# Patient Record
Sex: Male | Born: 1965
Health system: Southern US, Community
[De-identification: ages and names within clinical notes are randomized; demographics above are authoritative.]

## PROBLEM LIST (undated history)

## (undated) DIAGNOSIS — E785 Hyperlipidemia, unspecified: Secondary | ICD-10-CM

## (undated) DIAGNOSIS — S069X9A Unspecified intracranial injury with loss of consciousness of unspecified duration, initial encounter: Secondary | ICD-10-CM

## (undated) DIAGNOSIS — Z5900 Homelessness unspecified: Secondary | ICD-10-CM

## (undated) DIAGNOSIS — S134XXA Sprain of ligaments of cervical spine, initial encounter: Secondary | ICD-10-CM

## (undated) DIAGNOSIS — I10 Essential (primary) hypertension: Secondary | ICD-10-CM

## (undated) DIAGNOSIS — S069XAA Unspecified intracranial injury with loss of consciousness status unknown, initial encounter: Secondary | ICD-10-CM

## (undated) HISTORY — DX: Hyperlipidemia, unspecified: E78.5

## (undated) HISTORY — PX: APPENDECTOMY: SHX54

---

## 2011-07-06 ENCOUNTER — Encounter: Payer: Self-pay | Admitting: Emergency Medicine

## 2011-07-06 ENCOUNTER — Emergency Department (HOSPITAL_COMMUNITY)
Admission: EM | Admit: 2011-07-06 | Discharge: 2011-07-07 | Discharge status: Against Medical Advice | Payer: Self-pay | Attending: Emergency Medicine | Admitting: Emergency Medicine

## 2011-07-06 DIAGNOSIS — R42 Dizziness and giddiness: Secondary | ICD-10-CM | POA: Insufficient documentation

## 2011-07-06 HISTORY — DX: Essential (primary) hypertension: I10
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
## 2011-10-26 ENCOUNTER — Emergency Department (HOSPITAL_COMMUNITY)
Admission: EM | Admit: 2011-10-26 | Discharge: 2011-10-26 | Disposition: A | Discharge status: Home Health | Payer: Self-pay | Attending: Emergency Medicine | Admitting: Emergency Medicine

## 2011-10-26 ENCOUNTER — Encounter (HOSPITAL_COMMUNITY): Payer: Self-pay | Admitting: Family Medicine

## 2011-10-26 DIAGNOSIS — R1084 Generalized abdominal pain: Secondary | ICD-10-CM | POA: Insufficient documentation

## 2011-10-26 DIAGNOSIS — R112 Nausea with vomiting, unspecified: Secondary | ICD-10-CM | POA: Insufficient documentation

## 2011-10-26 DIAGNOSIS — I10 Essential (primary) hypertension: Secondary | ICD-10-CM | POA: Insufficient documentation

## 2011-10-26 DIAGNOSIS — E119 Type 2 diabetes mellitus without complications: Secondary | ICD-10-CM | POA: Insufficient documentation

## 2011-10-26 DIAGNOSIS — R197 Diarrhea, unspecified: Secondary | ICD-10-CM | POA: Insufficient documentation

## 2011-10-26 LAB — URINALYSIS, ROUTINE W REFLEX MICROSCOPIC
Glucose, UA: >1000 mg/dL — AB
Leukocytes, UA: NEGATIVE
Nitrite: NEGATIVE
Protein, ur: 30 mg/dL — AB
pH: 6 (ref 5.0–8.0)

## 2011-10-26 LAB — POCT I-STAT, CHEM 8
Calcium, Ion: 1.14 mmol/L (ref 1.12–1.32)
Creatinine, Ser: 1.2 mg/dL (ref 0.50–1.35)
Glucose, Bld: 333 mg/dL — ABNORMAL HIGH (ref 70–99)
Hemoglobin: 20.4 g/dL — ABNORMAL HIGH (ref 13.0–17.0)
Sodium: 137 mEq/L (ref 135–145)
TCO2: 27 mmol/L (ref 0–100)

## 2011-10-26 LAB — URINE MICROSCOPIC-ADD ON

## 2011-10-26 LAB — GLUCOSE, CAPILLARY: Glucose-Capillary: 300 mg/dL — ABNORMAL HIGH (ref 70–99)

## 2011-10-26 MED ORDER — ONDANSETRON HCL 4 MG/2ML IJ SOLN
4.0000 mg | Freq: Once | INTRAMUSCULAR | Status: AC
  Administered 2011-10-26: 4 mg via INTRAVENOUS
  Filled 2011-10-26: qty 2

## 2011-10-26 MED ORDER — METFORMIN HCL 500 MG PO TABS: 500.0000 mg | ORAL_TABLET | Freq: Two times a day (BID) | ORAL | Status: DC

## 2011-10-26 MED ORDER — PROMETHAZINE HCL 25 MG PO TABS: 25.0000 mg | ORAL_TABLET | Freq: Four times a day (QID) | ORAL | Status: DC | PRN

## 2011-10-26 MED ORDER — SODIUM CHLORIDE 0.9 % IV BOLUS (SEPSIS)
1000.0000 mL | Freq: Once | INTRAVENOUS | Status: AC
  Administered 2011-10-26: 1000 mL via INTRAVENOUS

## 2011-10-26 NOTE — Discharge Instructions (Signed)
Follow up with your primary care doctor about your hospital visit. Continue to hydrate orally.Take all medications as prescribed & use Phenergan as directed for nausea & vomiting.  Read the instructions below for reasons to return to the ER.  ° °The 'BRAT' diet is suggested, then progress to diet as tolerated as symptoms abate. Call if bloody stools, persistent diarrhea, vomiting, fever or abdominal pain. °Bananas.  °Rice.  °Applesauce.  °Toast (and other simple starches such as crackers, potatoes, noodles).  ° °SEEK IMMEDIATE MEDICAL ATTENTION IF: ° °You begin having localized abdominal pain that does not go away or becomes severe (The right side could  possibly be appendicitis. In an adult, the left lower portion of the abdomen could be colitis or diverticulitis) °  °A temperature above 101 develops ° °Repeated vomiting occurs (multiple uncontrollable episodes) or you are unable to keep fluids down ° °Blood is being passed in stools or vomit (bright red or black tarry stools).  ° °Return also if you develop chest pain, difficulty breathing, dizziness or fainting, or become confused, poorly responsive, or inconsolable (young children). ° ° °RESOURCE GUIDE ° °Dental Problems ° °Patients with Medicaid: °[facility]                     [facility] °[address]                                           [address] °Phone:  [phone]                                                  Phone:  [phone] ° °If unable to pay or uninsured, contact:  [facility] or [facility] to become qualified for the adult dental clinic. ° °Chronic Pain Problems °Contact [facility]  [phone] °Patients need to be referred by their primary care doctor. ° °Insufficient Money for Medicine °Contact [employer]:  call "211" or [facility] 271-5999. ° °No Primary Care Doctor °Call [facility]  832-8000 °Other agencies that provide inexpensive medical care ° [facility]  832-8035 °   [facility]  832-7272 °   [facility]  [phone] °   [facility]  832-4777 °   [facility]  [phone] °   [facility]  [phone] ° °Psychological Services ° Health  [phone] °[facility]  [phone] °[facility]   [phone] (emergency services 641-4993) ° °Substance Abuse Resources °[facility]  336-882-2125 °[facility] 336-784-9470 °[facility] 336-285-9073 °[facility] 336-845-3988 °Residential & Outpatient Substance Abuse Program  800-659-3381 ° °Abuse/Neglect °[place] [facility] (336) 641-3795 °[place] [facility] 800-378-5315 (After Hours) ° °Emergency Shelter °[facility] (336) 271-5985 ° °Maternity Homes °[facility] (336) 275-9566 °[facility] (704) 372-4663 ° °MRSA Hotline #:   832-7006 ° ° ° °[place] [facility] ° °[facility]     [employer]                          [facility] °[address] [place]                       [address]      [address]  °[place]                                                [place]                            [place] °Phone:  349-3220                                   Phone:  [phone]                 Phone:  342-8140 ° °[facility] °Phone:  342-8316 ° °[place] [facility] °(336) 342-1394 °(336) 342-3537 (After Hours) ° ° ° ° °

## 2011-10-26 NOTE — ED Notes (Signed)
Pt reports having N/V/D starting yesterday morning. Reports feeling weak and having abdominal pain.

## 2011-10-26 NOTE — ED Notes (Signed)
Pt given urinal and told of need for urine specimen. NAD noted at this time.

## 2011-10-26 NOTE — ED Provider Notes (Signed)
History     CSN: [id number]  Arrival date & time 10/26/11  1515   First MD Initiated Contact with Patient 10/26/11 1622      Chief Complaint  Patient presents with  . Abdominal Pain  . Emesis  . Diarrhea    (Consider location/radiation/quality/duration/timing/severity/associated sxs/prior treatment) HPI Comments: Patient comes in today with a chief complaint of nausea, vomiting, and diarrhea that began yesterday.  He reports three episodes of vomiting and approximately 10 episodes of diarrhea.  No blood in his emesis or stool.  He also is complaining of some generalized abdominal pain.  He describes the pain as a "hunger pain."  He denies any fever or chills.  He has not taken anything for his symptoms.  No sick contacts  Patient is a 46 y.o. male presenting with vomiting. The history is provided by the patient.  Emesis  The emesis has an appearance of stomach contents. There has been no fever. Associated symptoms include abdominal pain and diarrhea. Pertinent negatives include no chills and no fever.    Past Medical History  Diagnosis Date  . Diabetes mellitus   . Hypertension     Past Surgical History  Procedure Date  . Appendectomy     History reviewed. No pertinent family history.  History  Substance Use Topics  . Smoking status: Never Smoker   . Smokeless tobacco: Not on file  . Alcohol Use: No      Review of Systems  Constitutional: Negative for fever and chills.  Respiratory: Negative for shortness of breath.   Cardiovascular: Negative for chest pain.  Gastrointestinal: Positive for nausea, vomiting, abdominal pain and diarrhea. Negative for blood in stool and anal bleeding.  Genitourinary: Negative for dysuria, hematuria, decreased urine volume and difficulty urinating.  Neurological: Negative for dizziness, syncope and light-headedness.    Allergies  Review of patient's allergies indicates no known allergies.  Home Medications   Current Outpatient  Rx  Name Route Sig Dispense Refill  . ADULT MULTIVITAMIN W/MINERALS CH Oral Take 1 tablet by mouth daily.      BP 165/108  Pulse 82  Temp(Src) 98.3 F (36.8 C) (Oral)  Resp 16  SpO2 98%  Physical Exam  Nursing note and vitals reviewed. Constitutional: He appears well-developed and well-nourished. No distress.  HENT:  Head: Normocephalic and atraumatic.  Mouth/Throat: Oropharynx is clear and moist.  Neck: Normal range of motion. Neck supple.  Cardiovascular: Normal rate, regular rhythm and normal heart sounds.   Pulmonary/Chest: Effort normal and breath sounds normal.  Abdominal: Soft. Bowel sounds are normal. He exhibits no distension and no mass. There is no tenderness. There is no rebound and no guarding.  Neurological: He is alert.  Skin: Skin is warm and dry. He is not diaphoretic.  Psychiatric: He has a normal mood and affect.    ED Course  Procedures (including critical care time)   Labs Reviewed  URINALYSIS, ROUTINE W REFLEX MICROSCOPIC   No results found.   No diagnosis found.  7:45 PM Reassessed patient.  Patient able to tolerate po liquids.  Patient asking for something to eat.  MDM  Patient comes in today with a chief complaint of N/V/D and mild generalized abdominal pain.  Pt afebrile.  No abdominal tenderness to palpation on exam.  Patient given IVF and antiemetics.  Patient able to tolerate po liquids.  Patient discharged home with Rx for antiemetics.  Return precautions discussed.  Patient verbalizes understanding.        Pascal Lux  Maralyn Sago, PA-C 10/28/11 0107

## 2011-11-08 NOTE — ED Provider Notes (Signed)
Medical screening examination/treatment/procedure(s) were performed by non-physician practitioner and as supervising physician I was immediately available for consultation/collaboration.  Loren Racer, MD 11/08/11 [phone]

## 2012-04-29 ENCOUNTER — Emergency Department (HOSPITAL_COMMUNITY)
Admission: EM | Admit: 2012-04-29 | Discharge: 2012-04-29 | Disposition: A | Discharge status: Home / Self Care | Payer: No Typology Code available for payment source | Attending: Emergency Medicine | Admitting: Emergency Medicine

## 2012-04-29 ENCOUNTER — Encounter (HOSPITAL_COMMUNITY): Payer: Self-pay | Admitting: Emergency Medicine

## 2012-04-29 DIAGNOSIS — I10 Essential (primary) hypertension: Secondary | ICD-10-CM | POA: Insufficient documentation

## 2012-04-29 DIAGNOSIS — E119 Type 2 diabetes mellitus without complications: Secondary | ICD-10-CM | POA: Insufficient documentation

## 2012-04-29 DIAGNOSIS — Y92410 Unspecified street and highway as the place of occurrence of the external cause: Secondary | ICD-10-CM | POA: Insufficient documentation

## 2012-04-29 DIAGNOSIS — T07XXXA Unspecified multiple injuries, initial encounter: Secondary | ICD-10-CM | POA: Insufficient documentation

## 2012-04-29 DIAGNOSIS — Y9389 Activity, other specified: Secondary | ICD-10-CM | POA: Insufficient documentation

## 2012-04-29 MED ORDER — CYCLOBENZAPRINE HCL 10 MG PO TABS: 10.0000 mg | ORAL_TABLET | Freq: Two times a day (BID) | ORAL | Status: DC | PRN

## 2012-04-29 MED ORDER — IBUPROFEN 600 MG PO TABS: 600.0000 mg | ORAL_TABLET | Freq: Four times a day (QID) | ORAL | Status: DC | PRN
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
## 2012-04-29 NOTE — ED Provider Notes (Signed)
Medical screening examination/treatment/procedure(s) were performed by non-physician practitioner and as supervising physician I was immediately available for consultation/collaboration.  Raeford Razor, MD 04/29/12 [phone]
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
## 2012-05-29 ENCOUNTER — Ambulatory Visit: Payer: Self-pay | Attending: Orthopaedic Surgery | Admitting: Rehabilitative and Restorative Service Providers"

## 2012-05-29 DIAGNOSIS — R293 Abnormal posture: Secondary | ICD-10-CM | POA: Insufficient documentation

## 2012-05-29 DIAGNOSIS — M25519 Pain in unspecified shoulder: Secondary | ICD-10-CM | POA: Insufficient documentation

## 2012-05-29 DIAGNOSIS — M542 Cervicalgia: Secondary | ICD-10-CM | POA: Insufficient documentation

## 2012-05-29 DIAGNOSIS — IMO0001 Reserved for inherently not codable concepts without codable children: Secondary | ICD-10-CM | POA: Insufficient documentation

## 2012-05-31 ENCOUNTER — Ambulatory Visit: Payer: Self-pay | Admitting: Rehabilitative and Restorative Service Providers"

## 2012-06-01 ENCOUNTER — Encounter: Payer: Self-pay | Admitting: Physical Therapy

## 2012-06-02 ENCOUNTER — Encounter: Payer: Self-pay | Admitting: Physical Therapy

## 2012-06-06 ENCOUNTER — Ambulatory Visit: Payer: Self-pay | Admitting: Rehabilitative and Restorative Service Providers"

## 2012-06-08 ENCOUNTER — Ambulatory Visit: Payer: Self-pay | Admitting: Rehabilitative and Restorative Service Providers"

## 2012-06-09 ENCOUNTER — Ambulatory Visit: Payer: Self-pay | Admitting: Physical Therapy

## 2012-06-12 ENCOUNTER — Ambulatory Visit: Payer: Self-pay | Admitting: Rehabilitative and Restorative Service Providers"

## 2012-06-14 ENCOUNTER — Ambulatory Visit: Payer: Self-pay | Admitting: Physical Therapy

## 2012-06-15 ENCOUNTER — Ambulatory Visit: Payer: Self-pay | Admitting: Physical Therapy

## 2012-06-16 ENCOUNTER — Encounter: Payer: Self-pay | Admitting: Physical Therapy

## 2012-06-26 ENCOUNTER — Ambulatory Visit: Payer: Self-pay | Admitting: Rehabilitative and Restorative Service Providers"

## 2012-06-29 ENCOUNTER — Encounter: Payer: Self-pay | Admitting: Rehabilitative and Restorative Service Providers"

## 2012-07-03 ENCOUNTER — Encounter: Payer: Self-pay | Admitting: Physical Therapy

## 2012-07-05 ENCOUNTER — Encounter: Payer: Self-pay | Admitting: Physical Therapy

## 2012-07-30 ENCOUNTER — Emergency Department (HOSPITAL_COMMUNITY)
Admission: EM | Admit: 2012-07-30 | Discharge: 2012-07-31 | Disposition: A | Discharge status: Home / Self Care | Payer: Self-pay | Attending: Emergency Medicine | Admitting: Emergency Medicine

## 2012-07-30 ENCOUNTER — Emergency Department (HOSPITAL_COMMUNITY): Payer: Self-pay

## 2012-07-30 ENCOUNTER — Encounter (HOSPITAL_COMMUNITY): Payer: Self-pay | Admitting: Emergency Medicine

## 2012-07-30 DIAGNOSIS — E119 Type 2 diabetes mellitus without complications: Secondary | ICD-10-CM | POA: Insufficient documentation

## 2012-07-30 DIAGNOSIS — Y929 Unspecified place or not applicable: Secondary | ICD-10-CM | POA: Insufficient documentation

## 2012-07-30 DIAGNOSIS — Y9367 Activity, basketball: Secondary | ICD-10-CM | POA: Insufficient documentation

## 2012-07-30 DIAGNOSIS — S8990XA Unspecified injury of unspecified lower leg, initial encounter: Secondary | ICD-10-CM | POA: Insufficient documentation

## 2012-07-30 DIAGNOSIS — I10 Essential (primary) hypertension: Secondary | ICD-10-CM | POA: Insufficient documentation

## 2012-07-30 DIAGNOSIS — X58XXXA Exposure to other specified factors, initial encounter: Secondary | ICD-10-CM | POA: Insufficient documentation

## 2012-07-30 HISTORY — DX: Sprain of ligaments of cervical spine, initial encounter: S13.4XXA

## 2012-07-30 MED ORDER — IBUPROFEN 800 MG PO TABS
800.0000 mg | ORAL_TABLET | Freq: Once | ORAL | Status: AC
  Administered 2012-07-30: 800 mg via ORAL
  Filled 2012-07-30: qty 1

## 2012-07-30 NOTE — ED Provider Notes (Signed)
History   This chart was scribed for non-physician practitioner working with [name], DO by [name], ED Scribe. This patient was seen in room WTR6/WTR6 and the patient's care was started at 2306.   CSN: [id number]  Arrival date & time 07/30/12  2306   First MD Initiated Contact with Patient 07/30/12 2309      No chief complaint on file.    The history is provided by the patient. No language interpreter was used.    Vincent Osborne is a 47 y.o. male who presents to the Emergency Department complaining of chronic, constant, mild left knee pain for a while but worsened today after playing basketball. Pt reports jumping when his pain began but denies his knee popping or locking up. Pt rates his pain as 10/10. He has not tried any alleviating factors. Pt denies numbness, tingling or back pain.   Pt has h/o DM, HTN, appendectomy.  Pt denies smoking and alcohol use. Past Medical History  Diagnosis Date  . Diabetes mellitus   . Hypertension     Past Surgical History  Procedure Date  . Appendectomy     No family history on file.  History  Substance Use Topics  . Smoking status: Never Smoker   . Smokeless tobacco: Not on file  . Alcohol Use: No      Review of Systems  A complete 10 system review of systems was obtained and all systems are negative except as noted in the HPI and PMH.    Allergies  Review of patient's allergies indicates no known allergies.  Home Medications   Current Outpatient Rx  Name  Route  Sig  Dispense  Refill  . CYCLOBENZAPRINE HCL 10 MG PO TABS   Oral   Take 1 tablet (10 mg total) by mouth 2 (two) times daily as needed for muscle spasms.   20 tablet   0   . IBUPROFEN 600 MG PO TABS   Oral   Take 1 tablet (600 mg total) by mouth every 6 (six) hours as needed for pain.   30 tablet   0   . ADULT MULTIVITAMIN W/MINERALS CH   Oral   Take 1 tablet by mouth daily.           There were no vitals taken for this  visit.  Physical Exam  Nursing note and vitals reviewed. Constitutional: He is oriented to person, place, and time. He appears well-developed and well-nourished. No distress.  HENT:  Head: Normocephalic and atraumatic.  Eyes: Conjunctivae normal and EOM are normal.  Neck: Neck supple. No tracheal deviation present.  Cardiovascular: Normal rate, regular rhythm, normal heart sounds and intact distal pulses.   Pulses:      Dorsalis pedis pulses are 2+ on the left side.       Posterior tibial pulses are 2+ on the left side.  Pulmonary/Chest: Effort normal and breath sounds normal. No respiratory distress. He has no wheezes. He has no rales. He exhibits no tenderness.  Musculoskeletal: Normal range of motion.       L Knee- No swelling, erythema or bruising. Tenderness at the proximal aspect of the patellar tendon. Pain with ROM but does have full ROM.   Neurological: He is alert and oriented to person, place, and time. He has normal strength. No sensory deficit.  Skin: Skin is warm and dry.  Psychiatric: He has a normal mood and affect. His behavior is normal.    ED Course  Procedures (including critical  care time)  DIAGNOSTIC STUDIES: None performed.   COORDINATION OF CARE:  11:32 PM Discussed treatment plan which includes imaging of left knee and motrin with pt at bedside and pt agreed to plan.     Labs Reviewed - No data to display Dg Knee Complete 4 Views Left  07/31/2012  *RADIOLOGY REPORT*  Clinical Data: Left knee pain after basketball injury.  LEFT KNEE - COMPLETE 4+ VIEW  Comparison: None.  Findings: There is a small circumscribed lesion in the posterior femoral metaphyseal cortex with well-defined sclerotic margins. The appearance is most consistent with an old fibrous cortical defect.  No evidence of acute fracture or subluxation.  No expansile or destructive bone lesions.  Bone cortex and trabecular architecture appear intact.   No significant effusion.  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  No acute bony abnormalities.   Original Report Authenticated By: Burman Nieves, M.D.      1. Knee injury       MDM  47 y/o male with L knee pain s/p injury. Xray without any acute abnormality, however probably fibrous cortical defect in posterior distal femoral metaphysis present. Knee immobilizer and crutches given. Advised rest, ice and elevation. He will f/u with [place] [facility] who he has seen in the past. Patient states his understanding of plan and is agreeable.      I personally performed the services described in this documentation, which was scribed in my presence. The recorded information has been reviewed and is accurate.    [name], PA-C 07/31/12 [phone]

## 2012-07-30 NOTE — ED Notes (Signed)
Pt states he has had left knee pain for quite awhile without too much discomfort. Today he was playing basketball and jumped. Now his knee is hurting significantly more.

## 2012-07-31 NOTE — ED Provider Notes (Signed)
Medical screening examination/treatment/procedure(s) were performed by non-physician practitioner and as supervising physician I was immediately available for consultation/collaboration.   Menaal Russum M Kaizley Aja, DO 07/31/12 1807 

## 2012-09-01 ENCOUNTER — Encounter (HOSPITAL_COMMUNITY): Payer: Self-pay | Admitting: Emergency Medicine

## 2012-09-01 ENCOUNTER — Emergency Department (HOSPITAL_COMMUNITY)
Admission: EM | Admit: 2012-09-01 | Discharge: 2012-09-01 | Disposition: A | Discharge status: Home / Self Care | Payer: No Typology Code available for payment source | Attending: Emergency Medicine | Admitting: Emergency Medicine

## 2012-09-01 DIAGNOSIS — I10 Essential (primary) hypertension: Secondary | ICD-10-CM | POA: Insufficient documentation

## 2012-09-01 DIAGNOSIS — S0993XA Unspecified injury of face, initial encounter: Secondary | ICD-10-CM | POA: Insufficient documentation

## 2012-09-01 DIAGNOSIS — S8990XA Unspecified injury of unspecified lower leg, initial encounter: Secondary | ICD-10-CM | POA: Insufficient documentation

## 2012-09-01 DIAGNOSIS — Y9389 Activity, other specified: Secondary | ICD-10-CM | POA: Insufficient documentation

## 2012-09-01 DIAGNOSIS — S46909A Unspecified injury of unspecified muscle, fascia and tendon at shoulder and upper arm level, unspecified arm, initial encounter: Secondary | ICD-10-CM | POA: Insufficient documentation

## 2012-09-01 DIAGNOSIS — Y92410 Unspecified street and highway as the place of occurrence of the external cause: Secondary | ICD-10-CM | POA: Insufficient documentation

## 2012-09-01 DIAGNOSIS — M7918 Myalgia, other site: Secondary | ICD-10-CM

## 2012-09-01 DIAGNOSIS — IMO0002 Reserved for concepts with insufficient information to code with codable children: Secondary | ICD-10-CM | POA: Insufficient documentation

## 2012-09-01 DIAGNOSIS — S4980XA Other specified injuries of shoulder and upper arm, unspecified arm, initial encounter: Secondary | ICD-10-CM | POA: Insufficient documentation

## 2012-09-01 DIAGNOSIS — Z87828 Personal history of other (healed) physical injury and trauma: Secondary | ICD-10-CM | POA: Insufficient documentation

## 2012-09-01 DIAGNOSIS — S199XXA Unspecified injury of neck, initial encounter: Secondary | ICD-10-CM | POA: Insufficient documentation

## 2012-09-01 DIAGNOSIS — E119 Type 2 diabetes mellitus without complications: Secondary | ICD-10-CM | POA: Insufficient documentation

## 2012-09-01 MED ORDER — IBUPROFEN 400 MG PO TABS
400.0000 mg | ORAL_TABLET | Freq: Once | ORAL | Status: AC
  Administered 2012-09-01: 400 mg via ORAL
  Filled 2012-09-01: qty 1

## 2012-09-01 MED ORDER — TRAMADOL HCL 50 MG PO TABS: 50.0000 mg | ORAL_TABLET | Freq: Four times a day (QID) | ORAL | Status: DC | PRN
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
## 2012-09-01 NOTE — ED Provider Notes (Signed)
Medical screening examination/treatment/procedure(s) were performed by non-physician practitioner and as supervising physician I was immediately available for consultation/collaboration.  Nathan R. Pickering, MD 09/01/12 1519 
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
## 2012-09-22 ENCOUNTER — Emergency Department (HOSPITAL_COMMUNITY): Payer: No Typology Code available for payment source

## 2012-09-22 ENCOUNTER — Encounter (HOSPITAL_COMMUNITY): Payer: Self-pay | Admitting: Emergency Medicine

## 2012-09-22 ENCOUNTER — Emergency Department (HOSPITAL_COMMUNITY)
Admission: EM | Admit: 2012-09-22 | Discharge: 2012-09-22 | Disposition: A | Discharge status: Home / Self Care | Payer: No Typology Code available for payment source | Attending: Emergency Medicine | Admitting: Emergency Medicine

## 2012-09-22 DIAGNOSIS — S0003XA Contusion of scalp, initial encounter: Secondary | ICD-10-CM | POA: Insufficient documentation

## 2012-09-22 DIAGNOSIS — IMO0002 Reserved for concepts with insufficient information to code with codable children: Secondary | ICD-10-CM | POA: Insufficient documentation

## 2012-09-22 DIAGNOSIS — S161XXA Strain of muscle, fascia and tendon at neck level, initial encounter: Secondary | ICD-10-CM

## 2012-09-22 DIAGNOSIS — S0083XA Contusion of other part of head, initial encounter: Secondary | ICD-10-CM | POA: Insufficient documentation

## 2012-09-22 DIAGNOSIS — Y92410 Unspecified street and highway as the place of occurrence of the external cause: Secondary | ICD-10-CM | POA: Insufficient documentation

## 2012-09-22 DIAGNOSIS — Y9389 Activity, other specified: Secondary | ICD-10-CM | POA: Insufficient documentation

## 2012-09-22 DIAGNOSIS — I10 Essential (primary) hypertension: Secondary | ICD-10-CM | POA: Insufficient documentation

## 2012-09-22 DIAGNOSIS — E119 Type 2 diabetes mellitus without complications: Secondary | ICD-10-CM | POA: Insufficient documentation

## 2012-09-22 DIAGNOSIS — S139XXA Sprain of joints and ligaments of unspecified parts of neck, initial encounter: Secondary | ICD-10-CM | POA: Insufficient documentation

## 2012-09-22 DIAGNOSIS — T148XXA Other injury of unspecified body region, initial encounter: Secondary | ICD-10-CM

## 2012-09-22 MED ORDER — HYDROCODONE-ACETAMINOPHEN 5-325 MG PO TABS: 1.0000 | ORAL_TABLET | Freq: Four times a day (QID) | ORAL | Status: DC | PRN

## 2012-09-22 MED ORDER — PROMETHAZINE HCL 25 MG PO TABS: 25.0000 mg | ORAL_TABLET | Freq: Four times a day (QID) | ORAL | Status: DC | PRN

## 2012-09-22 MED ORDER — ACETAMINOPHEN 325 MG PO TABS
650.0000 mg | ORAL_TABLET | Freq: Once | ORAL | Status: AC
  Administered 2012-09-22: 650 mg via ORAL
  Filled 2012-09-22: qty 2

## 2012-09-22 MED ORDER — IBUPROFEN 600 MG PO TABS: 600.0000 mg | ORAL_TABLET | Freq: Four times a day (QID) | ORAL | Status: DC | PRN
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
## 2012-09-22 NOTE — ED Notes (Signed)
Pt given discharge paperwork.  Pt verbalized understanding of d/c and f/u.  No additional questions by pt regarding d/c.  VSS. E-signature obtained.
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
## 2012-09-22 NOTE — Progress Notes (Signed)
Orthopedic Tech Progress Note Patient Details:  Vincent Osborne 1965-12-26 [id number]  Ortho Devices Type of Ortho Device: Knee Immobilizer Ortho Device/Splint Location: (L) LE Ortho Device/Splint Interventions: Ordered;Application   Jennye Moccasin 09/22/2012, 6:06 PM

## 2012-11-07 ENCOUNTER — Ambulatory Visit: Payer: No Typology Code available for payment source

## 2012-11-07 ENCOUNTER — Ambulatory Visit: Payer: No Typology Code available for payment source | Attending: Orthopaedic Surgery | Admitting: Physical Therapy

## 2012-11-07 DIAGNOSIS — M25569 Pain in unspecified knee: Secondary | ICD-10-CM | POA: Insufficient documentation

## 2012-11-07 DIAGNOSIS — M25669 Stiffness of unspecified knee, not elsewhere classified: Secondary | ICD-10-CM | POA: Insufficient documentation

## 2012-11-07 DIAGNOSIS — IMO0001 Reserved for inherently not codable concepts without codable children: Secondary | ICD-10-CM | POA: Insufficient documentation

## 2012-11-14 ENCOUNTER — Ambulatory Visit: Payer: No Typology Code available for payment source | Admitting: Rehabilitation

## 2012-11-16 ENCOUNTER — Ambulatory Visit: Payer: No Typology Code available for payment source | Admitting: Rehabilitation

## 2012-11-21 ENCOUNTER — Ambulatory Visit: Payer: No Typology Code available for payment source | Admitting: Physical Therapy

## 2012-11-23 ENCOUNTER — Ambulatory Visit: Payer: No Typology Code available for payment source | Admitting: Rehabilitation

## 2012-11-28 ENCOUNTER — Ambulatory Visit: Payer: No Typology Code available for payment source | Attending: Orthopaedic Surgery | Admitting: Physical Therapy

## 2012-11-28 DIAGNOSIS — M25569 Pain in unspecified knee: Secondary | ICD-10-CM | POA: Insufficient documentation

## 2012-11-28 DIAGNOSIS — M25669 Stiffness of unspecified knee, not elsewhere classified: Secondary | ICD-10-CM | POA: Insufficient documentation

## 2012-11-28 DIAGNOSIS — IMO0001 Reserved for inherently not codable concepts without codable children: Secondary | ICD-10-CM | POA: Insufficient documentation

## 2012-11-30 ENCOUNTER — Ambulatory Visit: Payer: No Typology Code available for payment source | Admitting: Physical Therapy

## 2013-02-27 ENCOUNTER — Emergency Department (HOSPITAL_COMMUNITY): Payer: Self-pay

## 2013-02-27 ENCOUNTER — Inpatient Hospital Stay (HOSPITAL_COMMUNITY)
Admission: EM | Admit: 2013-02-27 | Discharge: 2013-03-01 | DRG: 065 | Disposition: A | Discharge status: Home / Self Care | Payer: Self-pay | Attending: Family Medicine | Admitting: Family Medicine

## 2013-02-27 DIAGNOSIS — E119 Type 2 diabetes mellitus without complications: Secondary | ICD-10-CM | POA: Diagnosis present

## 2013-02-27 DIAGNOSIS — G8190 Hemiplegia, unspecified affecting unspecified side: Secondary | ICD-10-CM | POA: Diagnosis present

## 2013-02-27 DIAGNOSIS — Z7982 Long term (current) use of aspirin: Secondary | ICD-10-CM

## 2013-02-27 DIAGNOSIS — Z79899 Other long term (current) drug therapy: Secondary | ICD-10-CM

## 2013-02-27 DIAGNOSIS — R279 Unspecified lack of coordination: Secondary | ICD-10-CM | POA: Diagnosis present

## 2013-02-27 DIAGNOSIS — E785 Hyperlipidemia, unspecified: Secondary | ICD-10-CM | POA: Diagnosis present

## 2013-02-27 DIAGNOSIS — R471 Dysarthria and anarthria: Secondary | ICD-10-CM | POA: Diagnosis present

## 2013-02-27 DIAGNOSIS — Z91199 Patient's noncompliance with other medical treatment and regimen due to unspecified reason: Secondary | ICD-10-CM

## 2013-02-27 DIAGNOSIS — Z9119 Patient's noncompliance with other medical treatment and regimen: Secondary | ICD-10-CM

## 2013-02-27 DIAGNOSIS — I6350 Cerebral infarction due to unspecified occlusion or stenosis of unspecified cerebral artery: Principal | ICD-10-CM | POA: Diagnosis present

## 2013-02-27 DIAGNOSIS — R29810 Facial weakness: Secondary | ICD-10-CM | POA: Diagnosis present

## 2013-02-27 DIAGNOSIS — I639 Cerebral infarction, unspecified: Secondary | ICD-10-CM | POA: Diagnosis present

## 2013-02-27 DIAGNOSIS — M3390 Dermatopolymyositis, unspecified, organ involvement unspecified: Secondary | ICD-10-CM

## 2013-02-27 DIAGNOSIS — I10 Essential (primary) hypertension: Secondary | ICD-10-CM | POA: Diagnosis present

## 2013-02-27 LAB — DIFFERENTIAL
Basophils Relative: 0 % (ref 0–1)
Lymphs Abs: 3.5 10*3/uL (ref 0.7–4.0)
Monocytes Relative: 7 % (ref 3–12)
Neutro Abs: 3.4 10*3/uL (ref 1.7–7.7)
Neutrophils Relative %: 44 % (ref 43–77)

## 2013-02-27 LAB — TROPONIN I: Troponin I: <0.3 ng/mL (ref <0.30)

## 2013-02-27 LAB — COMPREHENSIVE METABOLIC PANEL
ALT: 16 U/L (ref 0–53)
Albumin: 3.5 g/dL (ref 3.5–5.2)
Alkaline Phosphatase: 58 U/L (ref 39–117)
BUN: 13 mg/dL (ref 6–23)
Chloride: 101 mEq/L (ref 96–112)
Potassium: 3.3 mEq/L — ABNORMAL LOW (ref 3.5–5.1)
Sodium: 134 mEq/L — ABNORMAL LOW (ref 135–145)
Total Bilirubin: 0.7 mg/dL (ref 0.3–1.2)

## 2013-02-27 LAB — GLUCOSE, CAPILLARY: Glucose-Capillary: 225 mg/dL — ABNORMAL HIGH (ref 70–99)

## 2013-02-27 LAB — CBC
Hemoglobin: 15.5 g/dL (ref 13.0–17.0)
Platelets: 179 10*3/uL (ref 150–400)
RBC: 5.56 MIL/uL (ref 4.22–5.81)
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
## 2013-02-27 NOTE — ED Notes (Signed)
Pt states that last night at 9pm he started being off balance. Also began noticing that his speech was slurred. L side of face is somewhat drawn. Saliva pools on that side when speaking. L grip weaker. Has been dropping things. Also noticed his BS was high yesterday, 400s. Only takes metformin.

## 2013-02-28 ENCOUNTER — Inpatient Hospital Stay (HOSPITAL_COMMUNITY): Payer: Self-pay

## 2013-02-28 ENCOUNTER — Encounter (HOSPITAL_COMMUNITY): Payer: Self-pay | Admitting: Internal Medicine

## 2013-02-28 DIAGNOSIS — I639 Cerebral infarction, unspecified: Secondary | ICD-10-CM | POA: Diagnosis present

## 2013-02-28 DIAGNOSIS — E119 Type 2 diabetes mellitus without complications: Secondary | ICD-10-CM | POA: Diagnosis present

## 2013-02-28 DIAGNOSIS — I6789 Other cerebrovascular disease: Secondary | ICD-10-CM

## 2013-02-28 DIAGNOSIS — I10 Essential (primary) hypertension: Secondary | ICD-10-CM | POA: Diagnosis present

## 2013-02-28 LAB — LIPID PANEL
Cholesterol: 191 mg/dL (ref 0–200)
HDL: 35 mg/dL — ABNORMAL LOW (ref >39)
LDL Cholesterol: 119 mg/dL — ABNORMAL HIGH (ref 0–99)
Total CHOL/HDL Ratio: 5.5 RATIO
Triglycerides: 183 mg/dL — ABNORMAL HIGH (ref <150)
VLDL: 37 mg/dL (ref 0–40)

## 2013-02-28 LAB — GLUCOSE, CAPILLARY
Glucose-Capillary: 218 mg/dL — ABNORMAL HIGH (ref 70–99)
Glucose-Capillary: 212 mg/dL — ABNORMAL HIGH (ref 70–99)
Glucose-Capillary: 178 mg/dL — ABNORMAL HIGH (ref 70–99)

## 2013-02-28 LAB — CBC
HCT: 41.4 % (ref 39.0–52.0)
Hemoglobin: 14.5 g/dL (ref 13.0–17.0)
MCH: 28.2 pg (ref 26.0–34.0)
MCHC: 35 g/dL (ref 30.0–36.0)
MCV: 80.5 fL (ref 78.0–100.0)
RBC: 5.14 MIL/uL (ref 4.22–5.81)

## 2013-02-28 LAB — COMPREHENSIVE METABOLIC PANEL
Albumin: 3.2 g/dL — ABNORMAL LOW (ref 3.5–5.2)
Alkaline Phosphatase: 50 U/L (ref 39–117)
BUN: 12 mg/dL (ref 6–23)
CO2: 23 mEq/L (ref 19–32)
Chloride: 102 mEq/L (ref 96–112)
Creatinine, Ser: 1.05 mg/dL (ref 0.50–1.35)
GFR calc non Af Amer: 82 mL/min — ABNORMAL LOW (ref >90)
Potassium: 3.7 mEq/L (ref 3.5–5.1)
Total Bilirubin: 0.4 mg/dL (ref 0.3–1.2)

## 2013-02-28 LAB — RAPID URINE DRUG SCREEN, HOSP PERFORMED
Cocaine: NOT DETECTED
Opiates: NOT DETECTED
Tetrahydrocannabinol: NOT DETECTED

## 2013-02-28 LAB — HEMOGLOBIN A1C
Hgb A1c MFr Bld: 14 % — ABNORMAL HIGH (ref <5.7)
Mean Plasma Glucose: 355 mg/dL — ABNORMAL HIGH (ref <117)

## 2013-02-28 LAB — CREATININE, SERUM: Creatinine, Ser: 0.97 mg/dL (ref 0.50–1.35)

## 2013-02-28 MED ORDER — ATORVASTATIN CALCIUM 20 MG PO TABS
20.0000 mg | ORAL_TABLET | Freq: Every day | ORAL | Status: DC
  Administered 2013-02-28: 20 mg via ORAL
  Filled 2013-02-28 (×2): qty 1

## 2013-02-28 MED ORDER — ENOXAPARIN SODIUM 40 MG/0.4ML ~~LOC~~ SOLN
40.0000 mg | SUBCUTANEOUS | Status: DC
  Administered 2013-02-28 – 2013-03-01 (×2): 40 mg via SUBCUTANEOUS
  Filled 2013-02-28 (×3): qty 0.4

## 2013-02-28 MED ORDER — INSULIN ASPART 100 UNIT/ML ~~LOC~~ SOLN
0.0000 [IU] | Freq: Three times a day (TID) | SUBCUTANEOUS | Status: DC
  Administered 2013-02-28 (×2): 3 [IU] via SUBCUTANEOUS
  Administered 2013-03-01: 2 [IU] via SUBCUTANEOUS
  Administered 2013-03-01: 3 [IU] via SUBCUTANEOUS

## 2013-02-28 MED ORDER — LABETALOL HCL 100 MG PO TABS
100.0000 mg | ORAL_TABLET | Freq: Two times a day (BID) | ORAL | Status: DC
  Administered 2013-02-28: 100 mg via ORAL
  Filled 2013-02-28 (×2): qty 1

## 2013-02-28 MED ORDER — METFORMIN HCL 500 MG PO TABS
1000.0000 mg | ORAL_TABLET | Freq: Two times a day (BID) | ORAL | Status: DC
  Administered 2013-03-01: 1000 mg via ORAL
  Filled 2013-02-28 (×3): qty 2

## 2013-02-28 MED ORDER — ASPIRIN 325 MG PO TABS
325.0000 mg | ORAL_TABLET | Freq: Every day | ORAL | Status: DC
  Administered 2013-02-28 – 2013-03-01 (×2): 325 mg via ORAL
  Filled 2013-02-28 (×2): qty 1

## 2013-02-28 MED ORDER — INSULIN ASPART 100 UNIT/ML ~~LOC~~ SOLN: 0.0000 [IU] | SUBCUTANEOUS | Status: DC

## 2013-02-28 MED ORDER — INSULIN ASPART 100 UNIT/ML ~~LOC~~ SOLN
0.0000 [IU] | SUBCUTANEOUS | Status: DC
  Administered 2013-02-28 (×2): 3 [IU] via SUBCUTANEOUS
  Administered 2013-02-28: 2 [IU] via SUBCUTANEOUS
  Filled 2013-02-28: qty 1

## 2013-02-28 MED ORDER — METOPROLOL TARTRATE 50 MG PO TABS
50.0000 mg | ORAL_TABLET | Freq: Two times a day (BID) | ORAL | Status: DC
  Administered 2013-02-28 – 2013-03-01 (×2): 50 mg via ORAL
  Filled 2013-02-28 (×3): qty 1

## 2013-02-28 NOTE — Progress Notes (Signed)
  Echocardiogram 2D Echocardiogram has been performed.  Georgian Co 02/28/2013, 2:28 PM

## 2013-02-28 NOTE — Progress Notes (Signed)
PT Discharge Note  Patient Details Name: Vincent Osborne MRN: [id number] DOB: [birth year]   Cancelled Treatment:    Reason Eval/Treat Not Completed: PT screened, no needs identified, will sign off.  Pt up independently in room and states his only deficits are in L hand and speech.  Spoke with RN requesting OT and SLP evals.     Sunny Schlein, [place] [phone] 02/28/2013, 1:55 PM

## 2013-02-28 NOTE — Discharge Summary (Signed)
[facility] History and Physical  Vincent Osborne VHQ:[id number] DOB: 1966/05/21    PCP:   None.  Chief Complaint:  Dysarthria and left sided hemiparesis.  HPI: Vincent Osborne is an 47 y.o. male with hx of DM on metformin, HTN on no meds, and hx of severe medical noncompliance, presents to the ER with ictus of acute dysarthria, left facial droop, and left sided weakness and paresthesia over 24 hours prior to arrival.  He denied HA, nausea, vomiting, neck pain, fever or chills.  He was dropping objects from his left hand.  Reason for not seeking help?  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  His labs showed BS of 200's and normal Cr.  His EKG showed sinus rhythm.  He did have some improvement with his speech and left sided strength when I saw him.  Neurology was consulted and hospitalist was asked to admit him for completing his CVA work up.  He has not been on ASA prior to admission.    Procedure MRI/MRA 02/28/2013 IMPRESSION:  9 mm acute infarction affecting the right cerebral  peduncle/posterior limb internal capsule. No hemorrhage or  significant swelling.   Findings: Both internal carotid arteries are widely patent into the  brain. The anterior and middle cerebral vessels are normal without  proximal stenosis, aneurysm or vascular malformation.   CT head without contrast 02/27/2013 IMPRESSION:  Subtle age indeterminate hypodensity involving the posterior limb  of the right internal capsule. Finding may represent an acute  ischemic infarct. No acute intracranial hemorrhage identified.  Correlation with MRI could be performed as clinically indicated.  Echocardiogram 02/28/2013 Left ventricle: The cavity size was normal. Wall thickness was normal.  --LVEF: 45% to 50%. Wall motion was normal; there were no  regional wall motion abnormalities. Transthoracic echocardiography.   Carotid Doppler 02/28/2013 Final result called in as being within normal limits    Past Medical History  Diagnosis Date  . Diabetes mellitus   . Hypertension   . Whiplash     04/2012    Past Surgical History  Procedure Laterality Date  . Appendectomy      Medications:  HOME MEDS: Prior to Admission medications   Medication Sig Start Date End Date Taking? Authorizing Provider  metFORMIN (GLUCOPHAGE) 500 MG tablet Take 500 mg by mouth 2 (two) times daily with a meal.   Yes Historical Provider, MD  NAPROXEN PO Take 2 tablets by mouth daily as needed (pain).   Yes Historical Provider, MD     Allergies:  No Known Allergies  Social History:   reports that he has never smoked. He has never used smokeless tobacco. He reports that he does not drink alcohol or use illicit drugs.  Family History: History reviewed. No pertinent family history.   Physical Exam: Filed Vitals:   02/28/13 0220 02/28/13 0536 02/28/13 1012 02/28/13 1750  BP: 179/104 152/85 176/115 157/100  Pulse: 72 88 84 89  Temp: 98.1 F (36.7 C) 98.1 F (36.7 C) 98.2 F (36.8 C) 98.3 F (36.8 C)  TempSrc: Oral Oral Oral Oral  Resp: 18 18 18 20   Height:  6\' 2"  (1.88 m)    Weight:  95.845 kg (211 lb 4.8 oz)    SpO2: 99% 100% 100% 99%   Blood pressure 157/100, pulse 89, temperature 98.3 F (36.8 C), temperature source  Oral, resp. rate 20, height 6\' 2"  (1.88 m), weight 95.845 kg (211 lb 4.8 oz), SpO2 99.00%.  GEN: A/O x 4, NAD. PSYCH:  Poor understanding of disease process . HEART: Regular rate and rhythm.  There are no murmur, rub, or gallops.  DP/PT pulse 2+ bilateral ABDOMEN: soft and non-tender; no masses, no organomegaly, normal abdominal bowel sounds; no pannus; no intertriginous candida. There is no rebound and no distention. CNS: Cranial nerves 2-12 grossly intact, patient has mild facial drooping on the left, pupils equal round  reactive to light and accommodation, tongue and uvula midline, positive left-sided pronator drift, negative Romberg, negative aphasia, DTR are normal bilaterally, Babinski is negative and left strength is slightly weaker than right.  No sensory loss.   Labs on Admission:  Basic Metabolic Panel:  Recent Labs Lab 02/27/13 2225 02/28/13 0430  NA 134*  --   K 3.3*  --   CL 101  --   CO2 25  --   GLUCOSE 230*  --   BUN 13  --   CREATININE 1.05 0.97  CALCIUM 9.1  --    Liver Function Tests:  Recent Labs Lab 02/27/13 2225  AST 19  ALT 16  ALKPHOS 58  BILITOT 0.7  PROT 6.5  ALBUMIN 3.5   No results found for this basename: LIPASE, AMYLASE,  in the last 168 hours No results found for this basename: AMMONIA,  in the last 168 hours CBC:  Recent Labs Lab 02/27/13 2225 02/28/13 0430  WBC 7.7 7.7  NEUTROABS 3.4  --   HGB 15.5 14.5  HCT 45.1 41.4  MCV 81.1 80.5  PLT 179 186   Cardiac Enzymes:  Recent Labs Lab 02/27/13 2225  TROPONINI <0.30    CBG:  Recent Labs Lab 02/28/13 0107 02/28/13 0432 02/28/13 0750 02/28/13 1144 02/28/13 1644  GLUCAP 218* 184* 187* 212* 168*     Radiological Exams on Admission: Ct Head Wo Contrast  02/27/2013   *RADIOLOGY REPORT*  Clinical Data: Left-sided weakness and facial droop  CT HEAD WITHOUT CONTRAST  Technique:  Contiguous axial images were obtained from the base of the skull through the vertex without contrast.  Comparison: Prior study from 09/22/2012  Findings: There is no acute intracranial hemorrhage.  There is a subtle hypodensity involving the posterior limb of the right internal capsule (series 2, image 17)., not definitely seen on prior examination.  Finding may represent an ischemic infarct. Otherwise, gray-white matter differentiation is preserved.  No mass lesion or midline shift.  No extra-axial fluid collection.  IMPRESSION:  Subtle age indeterminate hypodensity involving the posterior limb of the right internal capsule.   Finding may represent an acute ischemic infarct.  No acute intracranial hemorrhage identified. Correlation with MRI could be performed as clinically indicated.   Original Report Authenticated By: Rise Mu, M.D.   Mri Brain Without Contrast  02/28/2013   *RADIOLOGY REPORT*  Clinical Data:  Acute stroke.  Left facial weakness and facial droop.  MRI HEAD WITHOUT CONTRAST MRA HEAD WITHOUT CONTRAST  Technique:  Multiplanar, multiecho pulse sequences of the brain and surrounding structures were obtained without intravenous contrast. Angiographic images of the head were obtained using MRA technique without contrast.  Comparison:  Head CT 02/27/2013  MRI HEAD  Findings:  Diffusion imaging shows an 9 mm acute infarction affecting the posterior limb internal capsule/cerebral peduncle on the right.  No hemorrhage.  Elsewhere, the brain has a normal appearance without other old infarction.  No mass lesion, hemorrhage, hydrocephalus  or extra-axial collection.  No pituitary mass.  No inflammatory sinus disease.  IMPRESSION: 9 mm acute infarction affecting the right cerebral peduncle/posterior limb internal capsule.  No hemorrhage or significant swelling.  MRA HEAD  Findings: Both internal carotid arteries are widely patent into the brain.  The anterior and middle cerebral vessels are normal without proximal stenosis, aneurysm or vascular malformation.  Both vertebral arteries are patent with the right being dominant. The basilar artery is smooth and of normal caliber.  Posterior circulation branch vessels are patent.  IMPRESSION: Normal intracranial MR angiography of the large and medium-sized vessels.   Original Report Authenticated By: Paulina Fusi, M.D.   Mr Mra Head/brain Wo Cm  02/28/2013   *RADIOLOGY REPORT*  Clinical Data:  Acute stroke.  Left facial weakness and facial droop.  MRI HEAD WITHOUT CONTRAST MRA HEAD WITHOUT CONTRAST  Technique:  Multiplanar, multiecho pulse sequences of the brain and surrounding  structures were obtained without intravenous contrast. Angiographic images of the head were obtained using MRA technique without contrast.  Comparison:  Head CT 02/27/2013  MRI HEAD  Findings:  Diffusion imaging shows an 9 mm acute infarction affecting the posterior limb internal capsule/cerebral peduncle on the right.  No hemorrhage.  Elsewhere, the brain has a normal appearance without other old infarction.  No mass lesion, hemorrhage, hydrocephalus or extra-axial collection.  No pituitary mass.  No inflammatory sinus disease.  IMPRESSION: 9 mm acute infarction affecting the right cerebral peduncle/posterior limb internal capsule.  No hemorrhage or significant swelling.  MRA HEAD  Findings: Both internal carotid arteries are widely patent into the brain.  The anterior and middle cerebral vessels are normal without proximal stenosis, aneurysm or vascular malformation.  Both vertebral arteries are patent with the right being dominant. The basilar artery is smooth and of normal caliber.  Posterior circulation branch vessels are patent.  IMPRESSION: Normal intracranial MR angiography of the large and medium-sized vessels.   Original Report Authenticated By: Paulina Fusi, M.D.   Assessment/Plan Present on Admission:  . Acute CVA (cerebrovascular accident) . HTN (hypertension) with goal to be determined . Diabetes   1. CVA; patient has small acute 9 mm acute infarction affecting the right cerebral      peduncle/posterior limb internal capsule  2. HTN; patient without insurance therefore we'll start him on metoprolol 50 mg    twice a day. Would also place patient on lisinopril prior to discharge. Would discharge patient until    better control of HTN  3 DM; patient without insurance; 02/28/2013 hemoglobin A1c= 14 restart   metformin 1000 mg twice a day. Considering patient's A1c really needs to be started on insulin which is not covered under the match program, therefore a second agent will need to be added  in the a.m. prior to discharge recommend glyburide 5 mg   Other plans as per orders.  Code Status: FULL CODE.   Drema Dallas, MD. [facility] Pager [phone] 7pm to 7am.  02/28/2013, 6:06 PM

## 2013-02-28 NOTE — Progress Notes (Signed)
Patient's B/P of 176/115. Patient denied any headache at this time. He verbalizes to NT that he was doing push-ups and walking in room prior to VS. Dr. Joseph Art aware. Educations regarding to hyper/hypo-tension, hyper/hypo-glycemic given at this time.  Will recheck B/P when patient return from MRI.   Sim Boast, RN 02/28/13 1030

## 2013-02-28 NOTE — Progress Notes (Signed)
MD made aware of physical therapist's recommendation for OT and ST.

## 2013-02-28 NOTE — H&P (Signed)
[facility] History and Physical  Montey Ebel UJW:[id number] DOB: 03/23/1966    PCP:   None.  Chief Complaint:  Dysarthria and left sided hemiparesis.  HPI: Vincent Osborne is an 47 y.o. male with hx of DM on metformin, HTN on no meds, and hx of severe medical noncompliance, presents to the ER with ictus of acute dysarthria, left facial droop, and left sided weakness and paresthesia over 24 hours prior to arrival.  He denied HA, nausea, vomiting, neck pain, fever or chills.  He was dropping objects from his left hand.  Reason for not seeking help?  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  His labs showed BS of 200's and normal Cr.  His EKG showed sinus rhythm.  He did have some improvement with his speech and left sided strength when I saw him.  Neurology was consulted and hospitalist was asked to admit him for completing his CVA work up.  He has not been on ASA prior to admission.  Rewiew of Systems:  Constitutional: Negative for malaise, fever and chills. No significant weight loss or weight gain Eyes: Negative for eye pain, redness and discharge, diplopia, visual changes, or flashes of light. ENMT: Negative for ear pain, hoarseness, nasal congestion, sinus pressure and sore throat. No headaches; tinnitus, drooling, or problem swallowing. Cardiovascular: Negative for chest pain, palpitations, diaphoresis, dyspnea and peripheral edema. ; No orthopnea, PND Respiratory: Negative for cough, hemoptysis, wheezing and stridor. No pleuritic chestpain. Gastrointestinal: Negative for nausea, vomiting, diarrhea, constipation, abdominal pain, melena, blood in stool, hematemesis, jaundice and rectal bleeding.    Genitourinary: Negative for frequency, dysuria, incontinence,flank pain and hematuria; Musculoskeletal: Negative for back  pain and neck pain. Negative for swelling and trauma.;  Skin: . Negative for pruritus, rash, abrasions, bruising and skin lesion.; ulcerations Neuro: Negative for headache, lightheadedness and neck stiffness. Negative for weakness, altered level of consciousness , altered mental status,  burning feet, involuntary movement, seizure and syncope.  Psych: negative for anxiety, depression, insomnia, tearfulness, panic attacks, hallucinations, paranoia, suicidal or homicidal ideation    Past Medical History  Diagnosis Date  . Diabetes mellitus   . Hypertension   . Whiplash     04/2012    Past Surgical History  Procedure Laterality Date  . Appendectomy      Medications:  HOME MEDS: Prior to Admission medications   Medication Sig Start Date End Date Taking? Authorizing Provider  metFORMIN (GLUCOPHAGE) 500 MG tablet Take 500 mg by mouth 2 (two) times daily with a meal.   Yes Historical Provider, MD  NAPROXEN PO Take 2 tablets by mouth daily as needed (pain).   Yes Historical Provider, MD     Allergies:  No Known Allergies  Social History:   reports that he has never smoked. He has never used smokeless tobacco. He reports that he does not drink alcohol or use illicit drugs.  Family History: No family history on file.   Physical Exam: Filed Vitals:   02/27/13 2227 02/27/13 2252 02/27/13 2330 02/28/13 0000  BP: 185/93  175/102 167/91  Pulse: 76  76 85  Temp: 98.7 F (37.1 C) 98 F (36.7 C)    TempSrc: Oral     Resp:   15 16  SpO2: 97%  98% 96%   Blood pressure 167/91, pulse 85, temperature 98 F (36.7  C), temperature source Oral, resp. rate 16, SpO2 96.00%.  GEN:  Pleasant patient lying in the stretcher in no acute distress; cooperative with exam. PSYCH:  alert and oriented x4; does not appear anxious or depressed; affect is appropriate. HEENT: Mucous membranes pink and anicteric; PERRLA; EOM intact; no cervical lymphadenopathy nor thyromegaly or carotid bruit; no JVD;  There were no stridor. Neck is very supple. Breasts:: Not examined CHEST WALL: No tenderness CHEST: Normal respiration, clear to auscultation bilaterally.  HEART: Regular rate and rhythm.  There are no murmur, rub, or gallops.   BACK: No kyphosis or scoliosis; no CVA tenderness ABDOMEN: soft and non-tender; no masses, no organomegaly, normal abdominal bowel sounds; no pannus; no intertriginous candida. There is no rebound and no distention. Rectal Exam: Not done EXTREMITIES: No bone or joint deformity; age-appropriate arthropathy of the hands and knees; no edema; no ulcerations.  There is no calf tenderness. Genitalia: not examined PULSES: 2+ and symmetric SKIN: Normal hydration no rash or ulceration CNS: Cranial nerves 2-12 grossly intact no focal lateralizing neurologic deficit.  Speech is fluent; uvula elevated with phonation, facial symmetry and tongue midline. DTR are normal bilaterally, cerebella exam is intact, barbinski is negative and left strength is slightly weaker than right.  No sensory loss.   Labs on Admission:  Basic Metabolic Panel:  Recent Labs Lab 02/27/13 2225  NA 134*  K 3.3*  CL 101  CO2 25  GLUCOSE 230*  BUN 13  CREATININE 1.05  CALCIUM 9.1   Liver Function Tests:  Recent Labs Lab 02/27/13 2225  AST 19  ALT 16  ALKPHOS 58  BILITOT 0.7  PROT 6.5  ALBUMIN 3.5   No results found for this basename: LIPASE, AMYLASE,  in the last 168 hours No results found for this basename: AMMONIA,  in the last 168 hours CBC:  Recent Labs Lab 02/27/13 2225  WBC 7.7  NEUTROABS 3.4  HGB 15.5  HCT 45.1  MCV 81.1  PLT 179   Cardiac Enzymes:  Recent Labs Lab 02/27/13 2225  TROPONINI <0.30    CBG:  Recent Labs Lab 02/27/13 2228  GLUCAP 225*     Radiological Exams on Admission: Ct Head Wo Contrast  02/27/2013   *RADIOLOGY REPORT*  Clinical Data: Left-sided weakness and facial droop  CT HEAD WITHOUT CONTRAST  Technique:  Contiguous axial images were  obtained from the base of the skull through the vertex without contrast.  Comparison: Prior study from 09/22/2012  Findings: There is no acute intracranial hemorrhage.  There is a subtle hypodensity involving the posterior limb of the right internal capsule (series 2, image 17)., not definitely seen on prior examination.  Finding may represent an ischemic infarct. Otherwise, gray-white matter differentiation is preserved.  No mass lesion or midline shift.  No extra-axial fluid collection.  IMPRESSION:  Subtle age indeterminate hypodensity involving the posterior limb of the right internal capsule.  Finding may represent an acute ischemic infarct.  No acute intracranial hemorrhage identified. Correlation with MRI could be performed as clinically indicated.   Original Report Authenticated By: Rise Mu, M.D.   Assessment/Plan Present on Admission:  . Acute CVA (cerebrovascular accident) . HTN (hypertension) with goal to be determined . DM (dermatomyositis)  PLAN:  Will admit him for stroke work up.  This will include MRI/MRA of the brain along with ECHO and carotid doppler.  Will let permissive HTN at this time.  For his DM, will hold metformin and give ISS.  He is stable, full code,  and will be admitted to [facility] service.  Neurology has been consulted as well.  Will start him on an ASA a day.    Other plans as per orders.  Code Status: FULL Unk Lightning, MD. [facility] Pager [phone] 7pm to 7am.  02/28/2013, 1:09 AM

## 2013-02-28 NOTE — Consult Note (Signed)
Referring Physician: Dr. Rubin Payor, [facility]    Chief Complaint: dysarthria, left face and arm weakness.   HPI:                                                                                                                                         Vincent Osborne is an 47 y.o. male, right handed, with a PMH significant for HTN and DM untreated for the past 2 years, transfer to [facility] for further evaluation of the above state symptoms. He expressed that his symptoms started at least 30 hours ago: " I started stumbling, had a HA, and then my friend couldn't understand me on the phone because I was slurring my words". 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 At the same time, the left side of his face became droopier than the right. Slow thinking process, confused at the right and green light driving yesterday and today. Poor sleep and significant stress lately. Denies vertigo, double vision, difficulty swallowing, visual disturbances. He decided to seek medical attention yesterday evening and had a CT brain that revealed a subtle age indeterminate hypodensity involving the posterior limb of the right internal capsule. Started on aspirin.   Date last known well: 02/26/13 Time last known well: uncertain tPA Given: no, late presentation NIHSS: 3 MRS: 0  Past Medical History  Diagnosis Date  . Diabetes mellitus   . Hypertension   . Whiplash     04/2012    Past Surgical History  Procedure Laterality Date  . Appendectomy      No family history on file. Social History:  reports that he has never smoked. He has never used smokeless tobacco. He reports that he does not drink alcohol or use illicit drugs.  Allergies: No Known Allergies  Medications:                                                                                                                           I have reviewed the patient's current medications.  ROS:  History obtained from the patient and chart review  General ROS: negative for - chills, fatigue, fever, night sweats, weight gain or weight loss Psychological ROS: negative for - behavioral disorder, hallucinations, memory difficulties, mood swings or suicidal ideation Ophthalmic ROS: negative for - blurry vision, double vision, eye pain or loss of vision ENT ROS: negative for - epistaxis, nasal discharge, oral lesions, sore throat, tinnitus or vertigo Allergy and Immunology ROS: negative for - hives or itchy/watery eyes Hematological and Lymphatic ROS: negative for - bleeding problems, bruising or swollen lymph nodes Endocrine ROS: negative for - galactorrhea, hair pattern changes, polydipsia/polyuria or temperature intolerance Respiratory ROS: negative for - cough, hemoptysis, shortness of breath or wheezing Cardiovascular ROS: negative for - chest pain, dyspnea on exertion, edema or irregular heartbeat Gastrointestinal ROS: negative for - abdominal pain, diarrhea, hematemesis, nausea/vomiting or stool incontinence Genito-Urinary ROS: negative for - dysuria, hematuria, incontinence or urinary frequency/urgency Musculoskeletal ROS: negative for - joint swelling or muscular weakness Neurological ROS: as noted in HPI Dermatological ROS: negative for rash and skin lesion changes    Physical exam: pleasant male in no apparent distress. Blood pressure 179/104, pulse 72, temperature 98.1 F (36.7 C), temperature source Oral, resp. rate 18, SpO2 99.00%. Head: normocephalic. Neck: supple, no bruits, no JVD. Cardiac: no murmurs. Lungs: clear. Abdomen: soft, no tender, no mass. Extremities: no edema.    Neurologic Examination:                                                                                                      Mental Status: Alert, oriented, thought content  appropriate. Very mild dysarthria. No aphasia.  Able to follow 3 step commands without difficulty. Cranial Nerves: II: Discs flat bilaterally; Visual fields grossly normal, pupils equal, round, reactive to light and accommodation III,IV, VI: ptosis not present, extra-ocular motions intact bilaterally V,VII: smile symmetric, facial light touch sensation normal bilaterally VIII: hearing normal bilaterally IX,X: gag reflex present XI: bilateral shoulder shrug XII: midline tongue extension Motor: Decreased rapid alternating movements left hand. Tone and bulk:normal tone throughout; no atrophy noted Sensory: Pinprick and light touch intact throughout, bilaterally Deep Tendon Reflexes:  1+ all over Plantars: Right: downgoing   Left: downgoing Cerebellar: normal finger-to-nose,  normal heel-to-shin test Gait: No ataxia. CV: pulses palpable throughout    Results for orders placed during the hospital encounter of 02/27/13 (from the past 48 hour(s))  CBC     Status: None   Collection Time    02/27/13 10:25 PM      Result Value Range   WBC 7.7  4.0 - 10.5 K/uL   RBC 5.56  4.22 - 5.81 MIL/uL   Hemoglobin 15.5  13.0 - 17.0 g/dL   HCT 16.1  09.6 - 04.5 %   MCV 81.1  78.0 - 100.0 fL   MCH 27.9  26.0 - 34.0 pg   MCHC 34.4  30.0 - 36.0 g/dL   RDW 40.9  81.1 - 91.4 %   Platelets 179  150 - 400 K/uL  DIFFERENTIAL     Status: None   Collection  Time    02/27/13 10:25 PM      Result Value Range   Neutrophils Relative % 44  43 - 77 %   Neutro Abs 3.4  1.7 - 7.7 K/uL   Lymphocytes Relative 46  12 - 46 %   Lymphs Abs 3.5  0.7 - 4.0 K/uL   Monocytes Relative 7  3 - 12 %   Monocytes Absolute 0.6  0.1 - 1.0 K/uL   Eosinophils Relative 3  0 - 5 %   Eosinophils Absolute 0.2  0.0 - 0.7 K/uL   Basophils Relative 0  0 - 1 %   Basophils Absolute 0.0  0.0 - 0.1 K/uL  COMPREHENSIVE METABOLIC PANEL     Status: Abnormal   Collection Time    02/27/13 10:25 PM      Result Value Range   Sodium 134 (*)  135 - 145 mEq/L   Potassium 3.3 (*) 3.5 - 5.1 mEq/L   Chloride 101  96 - 112 mEq/L   CO2 25  19 - 32 mEq/L   Glucose, Bld 230 (*) 70 - 99 mg/dL   BUN 13  6 - 23 mg/dL   Creatinine, Ser 0.98  0.50 - 1.35 mg/dL   Calcium 9.1  8.4 - 11.9 mg/dL   Total Protein 6.5  6.0 - 8.3 g/dL   Albumin 3.5  3.5 - 5.2 g/dL   AST 19  0 - 37 U/L   ALT 16  0 - 53 U/L   Alkaline Phosphatase 58  39 - 117 U/L   Total Bilirubin 0.7  0.3 - 1.2 mg/dL   GFR calc non Af Amer 82 (*) >90 mL/min   GFR calc Af Amer >90  >90 mL/min   Comment: (NOTE)     The eGFR has been calculated using the CKD EPI equation.     This calculation has not been validated in all clinical situations.     eGFR's persistently <90 mL/min signify possible Chronic Kidney     Disease.  TROPONIN I     Status: None   Collection Time    02/27/13 10:25 PM      Result Value Range   Troponin I <0.30  <0.30 ng/mL   Comment:            Due to the release kinetics of cTnI,     a negative result within the first hours     of the onset of symptoms does not rule out     myocardial infarction with certainty.     If myocardial infarction is still suspected,     repeat the test at appropriate intervals.  GLUCOSE, CAPILLARY     Status: Abnormal   Collection Time    02/27/13 10:28 PM      Result Value Range   Glucose-Capillary 225 (*) 70 - 99 mg/dL  POCT I-STAT TROPONIN I     Status: None   Collection Time    02/27/13 10:44 PM      Result Value Range   Troponin i, poc 0.00  0.00 - 0.08 ng/mL   Comment 3            Comment: Due to the release kinetics of cTnI,     a negative result within the first hours     of the onset of symptoms does not rule out     myocardial infarction with certainty.     If myocardial infarction is still suspected,     repeat  the test at appropriate intervals.  GLUCOSE, CAPILLARY     Status: Abnormal   Collection Time    02/28/13  1:07 AM      Result Value Range   Glucose-Capillary 218 (*) 70 - 99 mg/dL   Ct Head  Wo Contrast  02/27/2013   *RADIOLOGY REPORT*  Clinical Data: Left-sided weakness and facial droop  CT HEAD WITHOUT CONTRAST  Technique:  Contiguous axial images were obtained from the base of the skull through the vertex without contrast.  Comparison: Prior study from 09/22/2012  Findings: There is no acute intracranial hemorrhage.  There is a subtle hypodensity involving the posterior limb of the right internal capsule (series 2, image 17)., not definitely seen on prior examination.  Finding may represent an ischemic infarct. Otherwise, gray-white matter differentiation is preserved.  No mass lesion or midline shift.  No extra-axial fluid collection.  IMPRESSION:  Subtle age indeterminate hypodensity involving the posterior limb of the right internal capsule.  Finding may represent an acute ischemic infarct.  No acute intracranial hemorrhage identified. Correlation with MRI could be performed as clinically indicated.   Original Report Authenticated By: Rise Mu, M.D.     Assessment: 47 y.o. male with probable right subcortical lacunar infarct. Untreated HTN and DM. NIHSS 3. Feels that he is improving. Late presentation for thrombolytic therapy. Started on aspirin.  Stroke Risk Factors - HTN, DM.  Plan: 1. HgbA1c, fasting lipid panel 2. MRI, MRA  of the brain without contrast 3. Echocardiogram 4. Carotid dopplers 5. Prophylactic therapy-Antiplatelet med: Aspirin - dose 81 mg 6. Risk factor modification 7. Telemetry monitoring 8. Frequent neuro checks 9. PT/OT SLP  Wyatt Portela, MD [facility] [phone]  02/28/2013, 2:45 AM

## 2013-02-28 NOTE — Progress Notes (Signed)
Stroke Team Progress Note  HISTORY Vincent Osborne is an 47 y.o. male, right handed, with a PMH significant for HTN and DM untreated for the past 2 years, transfer to [facility] for further evaluation of the above state symptoms.   He expressed that his symptoms started at least 30 hours ago: " I started stumbling, had a HA, and then my friend couldn't understand me on the phone because I was slurring my words". 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 At the same time, the left side of his face became droopier than the right. Slow thinking process, confused at the right and green light driving yesterday and today. Poor sleep and significant stress lately.   Denies vertigo, double vision, difficulty swallowing, visual disturbances.  He decided to seek medical attention yesterday evening and had a CT brain that revealed a subtle age indeterminate hypodensity involving the posterior limb of the right internal capsule. Started on aspirin   Patient was not a TPA candidate secondary to out of window, symptoms over 30 hours  . He was admitted  for further evaluation and treatment.  SUBJECTIVE Sitting at the bedside.  Overall he feels his condition is gradually improving.  His work consists with driving. He does not see a doctor regularly due to not being able to qualify for insurance.  OBJECTIVE Most recent Vital Signs: Filed Vitals:   02/28/13 0100 02/28/13 0220 02/28/13 0536 02/28/13 1012  BP: 173/104 179/104 152/85 176/115  Pulse: 77 72 88 84  Temp:  98.1 F (36.7 C) 98.1 F (36.7 C) 98.2 F (36.8 C)  TempSrc:  Oral Oral Oral  Resp:  18 18 18   Height:   6\' 2"  (1.88 m)   Weight:   95.845 kg (211 lb 4.8 oz)   SpO2: 98% 99% 100% 100%   CBG (last 3)   Recent Labs  02/28/13 0432 02/28/13 0750 02/28/13 1144  GLUCAP 184* 187* 212*    IV Fluid Intake:     MEDICATIONS  . aspirin  325 mg Oral Daily  . enoxaparin (LOVENOX) injection  40  mg Subcutaneous Q24H  . insulin aspart  0-15 Units Subcutaneous TID AC & HS  . labetalol  100 mg Oral BID   PRN:    Diet:  Carb Control thin liquids Activity:  Bathroom privileges with assistance DVT Prophylaxis:  lovenox  CLINICALLY SIGNIFICANT STUDIES Basic Metabolic Panel:  Recent Labs Lab 02/27/13 2225 02/28/13 0430  NA 134*  --   K 3.3*  --   CL 101  --   CO2 25  --   GLUCOSE 230*  --   BUN 13  --   CREATININE 1.05 0.97  CALCIUM 9.1  --    Liver Function Tests:  Recent Labs Lab 02/27/13 2225  AST 19  ALT 16  ALKPHOS 58  BILITOT 0.7  PROT 6.5  ALBUMIN 3.5   CBC:  Recent Labs Lab 02/27/13 2225 02/28/13 0430  WBC 7.7 7.7  NEUTROABS 3.4  --   HGB 15.5 14.5  HCT 45.1 41.4  MCV 81.1 80.5  PLT 179 186   Coagulation: No results found for this basename: LABPROT, INR,  in the last 168 hours Cardiac Enzymes:  Recent Labs Lab 02/27/13 2225  TROPONINI <0.30   Urinalysis: No results found for this basename: COLORURINE, APPERANCEUR, LABSPEC, PHURINE, GLUCOSEU, HGBUR, BILIRUBINUR, KETONESUR, PROTEINUR, UROBILINOGEN, NITRITE, LEUKOCYTESUR,  in the last 168 hours Lipid Panel  Component Value Date/Time   CHOL 191 02/28/2013 0430   TRIG 183* 02/28/2013 0430   HDL 35* 02/28/2013 0430   CHOLHDL 5.5 02/28/2013 0430   VLDL 37 02/28/2013 0430   LDLCALC 119* 02/28/2013 0430   HgbA1C  Lab Results  Component Value Date   HGBA1C 14.0* 02/28/2013    Urine Drug Screen:     Component Value Date/Time   LABOPIA NONE DETECTED 02/28/2013 1311   COCAINSCRNUR NONE DETECTED 02/28/2013 1311   LABBENZ NONE DETECTED 02/28/2013 1311   AMPHETMU NONE DETECTED 02/28/2013 1311   THCU NONE DETECTED 02/28/2013 1311   LABBARB NONE DETECTED 02/28/2013 1311    Alcohol Level: No results found for this basename: ETH,  in the last 168 hours  Ct Head Wo Contrast 02/27/2013  Subtle age indeterminate hypodensity involving the posterior limb of the right internal capsule.  Finding may represent an acute ischemic  infarct.  No acute intracranial hemorrhage identified. Correlation with MRI could be performed as clinically indicated.    Mri Brain Without Contrast 02/28/2013    9 mm acute infarction affecting the right cerebral peduncle/posterior limb internal capsule.  No hemorrhage or significant swelling.   Mr Maxine Glenn Head/brain Wo Cm 02/28/2013  Normal intracranial MR angiography of the large and medium-sized vessels.   2D Echocardiogram    Carotid Doppler  EF 45=50%no wall motion abnormalities  CXR    EKG  normal sinus rhythm.   Therapy Recommendations NONE  Physical Exam   Awake alert. Afebrile. Head is nontraumatic. Neck is supple without bruit. Hearing is normal. Cardiac exam no murmur or gallop. Lungs are clear to auscultation. Distal pulses are well felt. Neurological Exam : Awake alert oriented x 3 normal speech and language. Mild left lower face asymmetry. Tongue midline. No drift. Mild diminished fine finger movements on left. Orbits right over left upper extremity. Mild left grip weak.. Normal sensation . Normal coordination. ASSESSMENT Vincent Osborne is a 47 y.o. male presenting with ataxia, slurred speech, left facial droop. Imaging confirms 9 mm acute infarction affecting the right cerebral peduncle/posterior limb internal capsule. Infarct felt to be thrombotic due to small vessel disease.  On no antithrombotics prior to admission. Now on aspirin 325 mg orally every day for secondary stroke prevention. Patient with resultant left hemiparesis. Work up underway.   Diabetes mellitus, type 2, uncontrolled, hgb a1c 14 (Previous meds showed glucophage)  Hyperlipidemia, LDL 119, goal < 70 in diabetic patients  Malignant hypertension, treated   Hospital day # 1  TREATMENT/PLAN  Continue aspirin 325 mg orally every day for secondary stroke prevention.  Add statin  BP management SBP goal 180  Risk factor modification  Have patient follow up with Dr. Pearlean Brownie in 2 months Patient needs to  establish primary medical care in the community before discharge for early follow up. I discussed findings with Dr. Tora Kindred D. Manson Passey, [facility], MBA, MHA [facility] Pager: [phone] 02/28/2013 4:19 PM  I have personally obtained a history, examined the patient, evaluated imaging results, and formulated the assessment and plan of care. I agree with the above. Delia Heady, MD

## 2013-02-28 NOTE — Progress Notes (Signed)
VASCULAR LAB PRELIMINARY  PRELIMINARY  PRELIMINARY  PRELIMINARY  Carotid duplex completed.    Preliminary report:  Bilateral:  1-39% ICA stenosis lowest end of scale. No evidence of plaque formation just mild intimal wall changes and tortuosity.Vertebral artery flow is antegrade.     Krystalyn Kubota, RVS 02/28/2013, 4:48 PM

## 2013-03-01 LAB — GLUCOSE, CAPILLARY
Glucose-Capillary: 130 mg/dL — ABNORMAL HIGH (ref 70–99)
Glucose-Capillary: 193 mg/dL — ABNORMAL HIGH (ref 70–99)

## 2013-03-01 MED ORDER — METOPROLOL TARTRATE 50 MG PO TABS: 50.0000 mg | ORAL_TABLET | Freq: Two times a day (BID) | ORAL | Status: DC

## 2013-03-01 MED ORDER — ASPIRIN 325 MG PO TABS: 325.0000 mg | ORAL_TABLET | Freq: Every day | ORAL | Status: DC

## 2013-03-01 MED ORDER — METFORMIN HCL 1000 MG PO TABS: 1000.0000 mg | ORAL_TABLET | Freq: Two times a day (BID) | ORAL | Status: DC

## 2013-03-01 NOTE — Discharge Summary (Signed)
Physician Discharge Summary  Vincent Osborne WGN:[id number] DOB: 30-Sep-1965 DOA: 02/27/2013  PCP: Pcp Not In System  Admit date: 02/27/2013 Discharge date: 03/01/2013  Time spent: 30 minutes  Recommendations for Outpatient Follow-up:  1. Needs regular physician, outpatient further management  Discharge Diagnoses:  Active Problems:   Acute CVA (cerebrovascular accident)   HTN (hypertension) with goal to be determined   Diabetes   Discharge Condition: Good  Diet recommendation:regular the  [facility] Weights   02/28/13 0536  Weight: 95.845 kg (211 lb 4.8 oz)    History of present illness:  47 yr old ?, no history of hypertension diabetes hyperlipidemia currently not on any medications presented with focal neurological deficit. Admitted overnight in hospital and was found to have CT scan revealing subtle age indeterminate hypodensity posterior limb right internal capsule-MRI confirmed 9 mm acute infarction right side, echocardiogram no wall motion abnormalities carotid Dopplers negative.  Patient had workup in hospital, was outside TPA window with symptoms over past 30 hours  Neurology saw patient recommended aspirin 325 mg Physical therapy occupational therapy and speech therapy saw patient given he had thickness and difficulty speaking but this resolved and he had no further need for home health    Discharge Exam: Filed Vitals:   03/01/13 0521  BP: 134/77  Pulse: 77  Temp: 97.8 F (36.6 C)  Resp: 18   alert pleasant mild slurring of the mouth on the left side, slurring of speech some past pointing on the left side , however not significant   General: Pleasant Cardiovascular: S1-S2 no murmur rub or gallop, normal sinus Respiratory: Clear  Discharge Instructions  Discharge Orders   Future Appointments Provider Department Dept Phone   03/15/2013 11:30 AM [facility] Covering Provider [facility] [phone]   Future Orders Complete By Expires   Diet -  low sodium heart healthy  As directed    Discharge instructions  As directed    Comments:     You were cared for by a hospitalist during your hospital stay. If you have any questions about your discharge medications or the care you received while you were in the hospital after you are discharged, you can call the unit and asked to speak with the  hospitalist on call if the hospitalist that took care of you is not available. Once you are discharged, your primary care physician will handle any further medical issues. Please note that NO REFILLS for any discharge medications will be authorized once you are discharged, as it is imperative that you return to your primary care physician (or establish a relationship with a primary care physician if you do not have one) for your aftercare needs so that they can reassess your need for medications and monitor your lab values. If you do not have a primary care physician, you can call [phone] for a physician referral.   Follow up apt made at the [facility] [phone]) for Sept 18, 2014 at 11:30am; B Shelba Flake   Increase activity slowly  As directed        Medication List         aspirin 325 MG tablet  Take 1 tablet (325 mg total) by mouth daily.     metFORMIN 1000 MG tablet  Commonly known as:  GLUCOPHAGE  Take 1 tablet (1,000 mg total) by mouth 2 (two) times daily with a meal.     metoprolol 50 MG tablet  Commonly known as:  LOPRESSOR  Take 1  tablet (50 mg total) by mouth 2 (two) times daily.     NAPROXEN PO  Take 2 tablets by mouth daily as needed (pain).       No Known Allergies     Follow-up Information   Follow up with [facility]     On 03/15/2013. (at 11:30am;please try to keep apt or call to reshcedule)    Contact information:   [address] [place] [address]        The results of significant diagnostics from this hospitalization (including imaging,  microbiology, ancillary and laboratory) are listed below for reference.    Significant Diagnostic Studies: Ct Head Wo Contrast  02/27/2013   *RADIOLOGY REPORT*  Clinical Data: Left-sided weakness and facial droop  CT HEAD WITHOUT CONTRAST  Technique:  Contiguous axial images were obtained from the base of the skull through the vertex without contrast.  Comparison: Prior study from 09/22/2012  Findings: There is no acute intracranial hemorrhage.  There is a subtle hypodensity involving the posterior limb of the right internal capsule (series 2, image 17)., not definitely seen on prior examination.  Finding may represent an ischemic infarct. Otherwise, gray-white matter differentiation is preserved.  No mass lesion or midline shift.  No extra-axial fluid collection.  IMPRESSION:  Subtle age indeterminate hypodensity involving the posterior limb of the right internal capsule.  Finding may represent an acute ischemic infarct.  No acute intracranial hemorrhage identified. Correlation with MRI could be performed as clinically indicated.   Original Report Authenticated By: Rise Mu, M.D.   Mri Brain Without Contrast  02/28/2013   *RADIOLOGY REPORT*  Clinical Data:  Acute stroke.  Left facial weakness and facial droop.  MRI HEAD WITHOUT CONTRAST MRA HEAD WITHOUT CONTRAST  Technique:  Multiplanar, multiecho pulse sequences of the brain and surrounding structures were obtained without intravenous contrast. Angiographic images of the head were obtained using MRA technique without contrast.  Comparison:  Head CT 02/27/2013  MRI HEAD  Findings:  Diffusion imaging shows an 9 mm acute infarction affecting the posterior limb internal capsule/cerebral peduncle on the right.  No hemorrhage.  Elsewhere, the brain has a normal appearance without other old infarction.  No mass lesion, hemorrhage, hydrocephalus or extra-axial collection.  No pituitary mass.  No inflammatory sinus disease.  IMPRESSION: 9 mm acute  infarction affecting the right cerebral peduncle/posterior limb internal capsule.  No hemorrhage or significant swelling.  MRA HEAD  Findings: Both internal carotid arteries are widely patent into the brain.  The anterior and middle cerebral vessels are normal without proximal stenosis, aneurysm or vascular malformation.  Both vertebral arteries are patent with the right being dominant. The basilar artery is smooth and of normal caliber.  Posterior circulation branch vessels are patent.  IMPRESSION: Normal intracranial MR angiography of the large and medium-sized vessels.   Original Report Authenticated By: Paulina Fusi, M.D.   Mr Mra Head/brain Wo Cm  02/28/2013   *RADIOLOGY REPORT*  Clinical Data:  Acute stroke.  Left facial weakness and facial droop.  MRI HEAD WITHOUT CONTRAST MRA HEAD WITHOUT CONTRAST  Technique:  Multiplanar, multiecho pulse sequences of the brain and surrounding structures were obtained without intravenous contrast. Angiographic images of the head were obtained using MRA technique without contrast.  Comparison:  Head CT 02/27/2013  MRI HEAD  Findings:  Diffusion imaging shows an 9 mm acute infarction affecting the posterior limb internal capsule/cerebral peduncle on the right.  No hemorrhage.  Elsewhere, the brain has a  normal appearance without other old infarction.  No mass lesion, hemorrhage, hydrocephalus or extra-axial collection.  No pituitary mass.  No inflammatory sinus disease.  IMPRESSION: 9 mm acute infarction affecting the right cerebral peduncle/posterior limb internal capsule.  No hemorrhage or significant swelling.  MRA HEAD  Findings: Both internal carotid arteries are widely patent into the brain.  The anterior and middle cerebral vessels are normal without proximal stenosis, aneurysm or vascular malformation.  Both vertebral arteries are patent with the right being dominant. The basilar artery is smooth and of normal caliber.  Posterior circulation branch vessels are patent.   IMPRESSION: Normal intracranial MR angiography of the large and medium-sized vessels.   Original Report Authenticated By: Paulina Fusi, M.D.    Microbiology: No results found for this or any previous visit (from the past 240 hour(s)).   Labs: Basic Metabolic Panel:  Recent Labs Lab 02/27/13 2225 02/28/13 0430 02/28/13 2030  NA 134*  --  135  K 3.3*  --  3.7  CL 101  --  102  CO2 25  --  23  GLUCOSE 230*  --  255*  BUN 13  --  12  CREATININE 1.05 0.97 1.05  CALCIUM 9.1  --  8.9   Liver Function Tests:  Recent Labs Lab 02/27/13 2225 02/28/13 2030  AST 19 15  ALT 16 13  ALKPHOS 58 50  BILITOT 0.7 0.4  PROT 6.5 6.2  ALBUMIN 3.5 3.2*   No results found for this basename: LIPASE, AMYLASE,  in the last 168 hours No results found for this basename: AMMONIA,  in the last 168 hours CBC:  Recent Labs Lab 02/27/13 2225 02/28/13 0430  WBC 7.7 7.7  NEUTROABS 3.4  --   HGB 15.5 14.5  HCT 45.1 41.4  MCV 81.1 80.5  PLT 179 186   Cardiac Enzymes:  Recent Labs Lab 02/27/13 2225  TROPONINI <0.30   BNP: BNP (last 3 results) No results found for this basename: PROBNP,  in the last 8760 hours CBG:  Recent Labs Lab 02/28/13 0750 02/28/13 1144 02/28/13 1644 02/28/13 2200 03/01/13 0716  GLUCAP 187* 212* 168* 178* 130*       Signed:  Khyren Hing, JAI-GURMUKH  [facility] 03/01/2013, 11:13 AM

## 2013-03-01 NOTE — Progress Notes (Signed)
Follow up apt made at the [facility] [phone]) for Sept 18, 2014 at 11:30am; B Shelba Flake

## 2013-03-01 NOTE — Progress Notes (Signed)
Stroke Team Progress Note  HISTORY Vincent Osborne is an 47 y.o. male, right handed, with a PMH significant for HTN and DM untreated for the past 2 years, transfer to [facility] for further evaluation of the above state symptoms.   He expressed that his symptoms started at least 30 hours ago: " I started stumbling, had a HA, and then my friend couldn't understand me on the phone because I was slurring my words". 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 At the same time, the left side of his face became droopier than the right. Slow thinking process, confused at the right and green light driving yesterday and today. Poor sleep and significant stress lately.   Denies vertigo, double vision, difficulty swallowing, visual disturbances.  He decided to seek medical attention yesterday evening and had a CT brain that revealed a subtle age indeterminate hypodensity involving the posterior limb of the right internal capsule. Started on aspirin   Patient was not a TPA candidate secondary to out of window, symptoms over 30 hours  . He was admitted  for further evaluation and treatment.  SUBJECTIVE Sitting at the bedside.  Overall he feels his condition is gradually improving.  He is hopeful for discharge today.  OBJECTIVE Most recent Vital Signs: Filed Vitals:   02/28/13 1750 02/28/13 2201 03/01/13 0146 03/01/13 0521  BP: 157/100 154/89 144/85 134/77  Pulse: 89 77 68 77  Temp: 98.3 F (36.8 C) 98 F (36.7 C)  97.8 F (36.6 C)  TempSrc: Oral Oral  Oral  Resp: 20 20 22 18   Height:      Weight:      SpO2: 99% 97% 98% 100%   CBG (last 3)   Recent Labs  02/28/13 2200 03/01/13 0716 03/01/13 1129  GLUCAP 178* 130* 193*    IV Fluid Intake:     MEDICATIONS  . aspirin  325 mg Oral Daily  . atorvastatin  20 mg Oral q1800  . enoxaparin (LOVENOX) injection  40 mg Subcutaneous Q24H  . insulin aspart  0-15 Units Subcutaneous TID AC & HS  .  metFORMIN  1,000 mg Oral BID WC  . metoprolol tartrate  50 mg Oral BID   PRN:    Diet:  Carb Control thin liquids Activity:  Bathroom privileges with assistance DVT Prophylaxis:  lovenox  CLINICALLY SIGNIFICANT STUDIES Basic Metabolic Panel:   Recent Labs Lab 02/27/13 2225 02/28/13 0430 02/28/13 2030  NA 134*  --  135  K 3.3*  --  3.7  CL 101  --  102  CO2 25  --  23  GLUCOSE 230*  --  255*  BUN 13  --  12  CREATININE 1.05 0.97 1.05  CALCIUM 9.1  --  8.9   Liver Function Tests:   Recent Labs Lab 02/27/13 2225 02/28/13 2030  AST 19 15  ALT 16 13  ALKPHOS 58 50  BILITOT 0.7 0.4  PROT 6.5 6.2  ALBUMIN 3.5 3.2*   CBC:   Recent Labs Lab 02/27/13 2225 02/28/13 0430  WBC 7.7 7.7  NEUTROABS 3.4  --   HGB 15.5 14.5  HCT 45.1 41.4  MCV 81.1 80.5  PLT 179 186   Coagulation: No results found for this basename: LABPROT, INR,  in the last 168 hours Cardiac Enzymes:   Recent Labs Lab 02/27/13 2225  TROPONINI <0.30   Urinalysis: No results found for this basename: COLORURINE, APPERANCEUR, LABSPEC, PHURINE,  [place], HGBUR, BILIRUBINUR, KETONESUR, PROTEINUR, UROBILINOGEN, NITRITE, LEUKOCYTESUR,  in the last 168 hours Lipid Panel    Component Value Date/Time   CHOL 191 02/28/2013 0430   TRIG 183* 02/28/2013 0430   HDL 35* 02/28/2013 0430   CHOLHDL 5.5 02/28/2013 0430   VLDL 37 02/28/2013 0430   LDLCALC 119* 02/28/2013 0430   HgbA1C  Lab Results  Component Value Date   HGBA1C 14.0* 02/28/2013    Urine Drug Screen:     Component Value Date/Time   LABOPIA NONE DETECTED 02/28/2013 1311   COCAINSCRNUR NONE DETECTED 02/28/2013 1311   LABBENZ NONE DETECTED 02/28/2013 1311   AMPHETMU NONE DETECTED 02/28/2013 1311   THCU NONE DETECTED 02/28/2013 1311   LABBARB NONE DETECTED 02/28/2013 1311    Alcohol Level: No results found for this basename: ETH,  in the last 168 hours  Ct Head Wo Contrast 02/27/2013  Subtle age indeterminate hypodensity involving the posterior limb of the right  internal capsule.  Finding may represent an acute ischemic infarct.  No acute intracranial hemorrhage identified. Correlation with MRI could be performed as clinically indicated.    Mri Brain Without Contrast 02/28/2013    9 mm acute infarction affecting the right cerebral peduncle/posterior limb internal capsule.  No hemorrhage or significant swelling.   Mr Maxine Glenn Head/brain Wo Cm 02/28/2013  Normal intracranial MR angiography of the large and medium-sized vessels.   2D Echocardiogram  EF 45=50%no wall motion abnormalities  Carotid Doppler  report: Bilateral: 1-39% ICA stenosis lowest end of scale. No evidence of plaque formation just mild intimal wall changes and tortuosity.Vertebral artery flow is antegrade.   CXR    EKG  normal sinus rhythm.   Therapy Recommendations NONE  Physical Exam   Awake alert. Afebrile. Head is nontraumatic. Neck is supple without bruit. Hearing is normal. Cardiac exam no murmur or gallop. Lungs are clear to auscultation. Distal pulses are well felt. Neurological Exam : Awake alert oriented x 3 normal speech and language. Mild left lower face asymmetry. Tongue midline. No drift. Mild diminished fine finger movements on left. Orbits right over left upper extremity. Mild left grip weak.. Normal sensation . Normal coordination. ASSESSMENT Mr. Vincent Osborne is a 47 y.o. male presenting with ataxia, slurred speech, left facial droop. Imaging confirms 9 mm acute infarction affecting the right cerebral peduncle/posterior limb internal capsule. Infarct felt to be thrombotic due to small vessel disease.  On no antithrombotics prior to admission. Now on aspirin 325 mg orally every day for secondary stroke prevention. Patient with resultant left hemiparesis. Work up underway.   Diabetes mellitus, type 2, uncontrolled, hgb a1c 14 (Previous meds showed glucophage)  Hyperlipidemia, LDL 119, goal < 70 in diabetic patients  Malignant hypertension, treated   Hospital day #  2  TREATMENT/PLAN  Continue aspirin 325 mg orally every day for secondary stroke prevention.  Risk factor modification  Have patient follow up with Dr. Pearlean Brownie in 2 months  Patient needs to establish primary medical care in the community before discharge for early follow up. I discussed findings with Dr. Tora Kindred D. Manson Passey, [facility], MBA, MHA [facility] Pager: [phone] 03/01/2013 1:12 PM  I have personally obtained a history, examined the patient, evaluated imaging results, and formulated the assessment and plan of care. I agree with the above. Delia Heady, MD

## 2013-03-01 NOTE — Evaluation (Signed)
Speech Language Pathology Evaluation Patient Details Name: Vincent Osborne MRN: [id number] DOB: Dec 15, 1965 Today's Date: 03/01/2013 Time: 5621-3086 SLP Time Calculation (min): 32 min  Problem List:  Patient Active Problem List   Diagnosis Date Noted  . Acute CVA (cerebrovascular accident) 02/28/2013  . HTN (hypertension) with goal to be determined 02/28/2013  . Diabetes 02/28/2013   Past Medical History:  Past Medical History  Diagnosis Date  . Diabetes mellitus   . Hypertension   . Whiplash     04/2012   Past Surgical History:  Past Surgical History  Procedure Laterality Date  . Appendectomy     HPI:  47 y.o. male with hx of DM on metformin, HTN on no meds, and hx of severe medical noncompliance, presents to the ER with ictus of acute dysarthria, left facial droop, and left sided weakness and paresthesia over 24 hours prior to arrival.  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  MRI revealed 9 mm acute infarction affecting the right cerebral peduncle/posterior limb internal capsule. No hemorrhage or significant swelling.   Assessment / Plan / Recommendation Clinical Impression  Pt. exhibits mild dysarthria in conversation.  Speech is 98% intelligible with occassional instances of distored phonemes and decreased pauses between words.  Cognitive abilities with reasoning, judgement, problem solving, memory and awareness are WFL's.  SLP demonstrated oral-motor exercies for increased labial ROM and strength and stategies to prevent mild drooling of left.  Strategies for increased intelligibility were discussed and practiced.  No Speech therapy is required for pt. at this time.     SLP Assessment  Patient does not need any further Speech Lanaguage Pathology Services    Follow Up Recommendations  None    Frequency and  Duration        Pertinent Vitals/Pain none   SLP Goals     SLP Evaluation Prior Functioning  Cognitive/Linguistic Baseline: Within functional limits  Lives With: Spouse Vocation: Full time employment (cab driver)   Cognition  Overall Cognitive Status: Within Functional Limits for tasks assessed Arousal/Alertness: Awake/alert Orientation Level: Oriented X4 Attention: Sustained Sustained Attention: Appears intact Memory: Appears intact Awareness: Appears intact Problem Solving: Appears intact Safety/Judgment: Appears intact    Comprehension  Auditory Comprehension Overall Auditory Comprehension: Appears within functional limits for tasks assessed Visual Recognition/Discrimination Discrimination: Not tested Reading Comprehension Reading Status: Not tested    Expression Expression Primary Mode of Expression: Verbal Verbal Expression Overall Verbal Expression: Appears within functional limits for tasks assessed Initiation: No impairment Level of Generative/Spontaneous Verbalization: Conversation Repetition: No impairment Naming: No impairment Pragmatics: No impairment Written Expression Written Expression: Not tested   Oral / Motor Oral Motor/Sensory Function Overall Oral Motor/Sensory Function: Impaired Labial ROM: Reduced left Labial Symmetry: Abnormal symmetry left Labial Strength: Reduced Labial Sensation: Within Functional Limits Lingual ROM: Within Functional Limits Lingual Symmetry: Within Functional Limits Lingual Strength: Reduced Lingual Sensation: Within Functional Limits Facial ROM: Reduced left Facial Symmetry: Left droop Facial Strength: Reduced Facial Sensation: Within Functional Limits Mandible: Within Functional Limits Motor Speech Overall Motor Speech: Impaired Respiration: Within functional limits Phonation: Normal Resonance: Within functional limits Articulation: Within functional limitis Intelligibility: Intelligibility reduced Word:  75-100% accurate Sentence: 75-100% accurate Conversation: Not tested Motor Planning: Witnin functional limits   GO     Breck Coons [employer] [phone]  03/01/2013

## 2013-03-01 NOTE — Progress Notes (Signed)
Discharge information reviewed with patient. All questions answered at this time. All belongs sent with patients.   Sim Boast, Rn 03/01/13 1508

## 2013-03-01 NOTE — Evaluation (Signed)
Occupational Therapy Evaluation Patient Details Name: Vincent Osborne MRN: [id number] DOB: 1966/04/13 Today's Date: 03/01/2013 Time: 1230-1300 OT Time Calculation (min): 30 min  OT Assessment / Plan / Recommendation History of present illness 47 y.o. male with hx of DM on metformin, HTN on no meds, and hx of severe medical noncompliance, presents to the ER with ictus of acute dysarthria, left facial droop, and left sided weakness and paresthesia over 24 hours prior to arrival.  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  MRI revealed 9 mm acute infarction affecting the right cerebral peduncle/posterior limb internal capsule. No hemorrhage or significant swelling   Clinical Impression   Patient evaluated by Occupational Therapy with no further acute OT needs identified. All education has been completed and the patient has no further questions. See below for any follow-up Occupational Therapy or equipment needs. OT to sign off. Thank you for referral.      OT Assessment  Patient does not need any further OT services    Follow Up Recommendations  No OT follow up (recommend Outpatient follow up / no insurance)    Barriers to Discharge      Equipment Recommendations  None recommended by OT    Recommendations for Other Services    Frequency       Precautions / Restrictions Precautions Precautions: None   Pertinent Vitals/Pain None Reports tongue feeling thick    ADL  Eating/Feeding: Independent Where Assessed - Eating/Feeding: Chair ADL Comments: Pt demonstrates independence with all adls using Rt UE. pt demonstrates deficits with motor planning left UE. Pt with HEP program education. Pt with vision assess and no acute findings. Pt completed newspaper puzzle prior to OT arrival.     OT Diagnosis:    OT Problem List:    OT Treatment Interventions:     OT Goals(Current goals can be found in the care plan section)    Visit Information  Last OT Received On: 03/01/13 Assistance Needed: +1 History of Present Illness: 47 y.o. male with hx of DM on metformin, HTN on no meds, and hx of severe medical noncompliance, presents to the ER with ictus of acute dysarthria, left facial droop, and left sided weakness and paresthesia over 24 hours prior to arrival.  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  MRI revealed 9 mm acute infarction affecting the right cerebral peduncle/posterior limb internal capsule. No hemorrhage or significant swelling       Prior Functioning     Home Living Family/patient expects to be discharged to:: Private residence Living Arrangements: Alone  Lives With: Alone Prior Function Level of Independence: Independent Communication Communication: Expressive difficulties (states tongue feels heavy) Dominant Hand: Right         Vision/Perception Vision - History Baseline Vision: No visual deficits Patient Visual Report: No change from baseline Vision - Assessment Eye Alignment: Within Functional Limits Vision Assessment: Vision tested Ocular Range of Motion: Within Functional Limits   Cognition  Cognition Arousal/Alertness: Awake/alert Behavior During Therapy: WFL for tasks assessed/performed Overall Cognitive Status: Within Functional Limits for tasks assessed    Extremity/Trunk Assessment Upper Extremity Assessment Upper Extremity Assessment: LUE deficits/detail LUE Deficits / Details: pt demonstrates  3+ out 5 strength throughout. Pt demonstrates apraxic motor planning. Pt educated on exercise program and follow up. Pt educated on functional grasp and reaching patterns to help with LT UE functional recovery.  Pt demonstrates ability to reach outside base of support. Pt initially clawing at cylindrical object. Pt with education and repetition able to grasp with wrist in neutral position.  Lower Extremity Assessment Lower Extremity Assessment: Defer to PT evaluation Cervical / Trunk Assessment Cervical / Trunk Assessment: Normal     Mobility Bed Mobility Bed Mobility: Not assessed Transfers Transfers: Sit to Stand;Stand to Sit Sit to Stand: 7: Independent Stand to Sit: 7: Independent     Exercise     Balance     End of Session OT - End of Session Activity Tolerance: Patient tolerated treatment well Patient left: in chair;with call bell/phone within reach;Other (comment) (MD in room)  GO     Boone Master B 03/01/2013, 2:30 PM Pager: [phone]

## 2013-03-15 ENCOUNTER — Ambulatory Visit: Payer: Self-pay | Attending: Internal Medicine | Admitting: Internal Medicine

## 2013-03-15 ENCOUNTER — Encounter: Payer: Self-pay | Admitting: Internal Medicine

## 2013-03-15 VITALS — BP 151/104 | HR 74 | Temp 98.5°F | Resp 16 | Ht 74.0 in | Wt 211.0 lb

## 2013-03-15 DIAGNOSIS — E119 Type 2 diabetes mellitus without complications: Secondary | ICD-10-CM | POA: Insufficient documentation

## 2013-03-15 DIAGNOSIS — Z7982 Long term (current) use of aspirin: Secondary | ICD-10-CM | POA: Insufficient documentation

## 2013-03-15 DIAGNOSIS — Z79899 Other long term (current) drug therapy: Secondary | ICD-10-CM | POA: Insufficient documentation

## 2013-03-15 DIAGNOSIS — I10 Essential (primary) hypertension: Secondary | ICD-10-CM | POA: Insufficient documentation

## 2013-03-15 DIAGNOSIS — I69998 Other sequelae following unspecified cerebrovascular disease: Secondary | ICD-10-CM | POA: Insufficient documentation

## 2013-03-15 DIAGNOSIS — Z09 Encounter for follow-up examination after completed treatment for conditions other than malignant neoplasm: Secondary | ICD-10-CM | POA: Insufficient documentation

## 2013-03-15 NOTE — Progress Notes (Signed)
HFU 3 weeks ago pt had a stroke, caused slurred speech and made his fingers move slow. Pt reports of having HTN and diabetes.

## 2013-03-15 NOTE — Patient Instructions (Signed)

## 2013-03-15 NOTE — Progress Notes (Signed)
Patient ID: Vincent Osborne, male   DOB: [birth year], 47 y.o.   MRN: [id number]  CC: Regular followup  HPI: Patient is 47 year old male, very pleasant, has diabetes and hypertension, recent stroke, presenting to clinic for followup on recent hospitalization. Patient explains he's been doing well, reports compliance with medications. He denies chest pain or shortness of breath, he reports compliance with physical therapy and feels that he strength is getting better. He does not need refill on medications.  No Known Allergies Past Medical History  Diagnosis Date  . Diabetes mellitus   . Hypertension   . Whiplash     04/2012   Current Outpatient Prescriptions on File Prior to Visit  Medication Sig Dispense Refill  . aspirin 325 MG tablet Take 1 tablet (325 mg total) by mouth daily.  30 tablet  12  . metFORMIN (GLUCOPHAGE) 1000 MG tablet Take 1 tablet (1,000 mg total) by mouth 2 (two) times daily with a meal.  60 tablet  0  . metoprolol (LOPRESSOR) 50 MG tablet Take 1 tablet (50 mg total) by mouth 2 (two) times daily.  60 tablet  0  . NAPROXEN PO Take 2 tablets by mouth daily as needed (pain).       No current facility-administered medications on file prior to visit.   No family history on file. History   Social History  . Marital Status: Legally Separated    Spouse Name: N/A    Number of Children: N/A  . Years of Education: N/A   Occupational History  . Not on file.   Social History Main Topics  . Smoking status: Never Smoker   . Smokeless tobacco: Never Used  . Alcohol Use: No  . Drug Use: No  . Sexual Activity:    Other Topics Concern  . Not on file   Social History Narrative  . No narrative on file    Review of Systems  Constitutional: Negative for fever, chills, diaphoresis, activity change, appetite change and fatigue.  HENT: Negative for ear pain, nosebleeds, congestion, facial swelling, rhinorrhea, neck pain, neck stiffness and ear discharge.   Eyes: Negative for  pain, discharge, redness, itching and visual disturbance.  Respiratory: Negative for cough, choking, chest tightness, shortness of breath, wheezing and stridor.   Cardiovascular: Negative for chest pain, palpitations and leg swelling.  Gastrointestinal: Negative for abdominal distention.  Genitourinary: Negative for dysuria, urgency, frequency, hematuria, flank pain, decreased urine volume, difficulty urinating and dyspareunia.  Musculoskeletal: Negative for back pain, joint swelling, arthralgias and gait problem.  Neurological: Negative for dizziness, tremors, seizures, syncope, facial asymmetry, speech difficulty, weakness, light-headedness, numbness and headaches.  Hematological: Negative for adenopathy. Does not bruise/bleed easily.  Psychiatric/Behavioral: Negative for hallucinations, behavioral problems, confusion, dysphoric mood, decreased concentration and agitation.    Objective:  There were no vitals filed for this visit.  Physical Exam  Constitutional: Appears well-developed and well-nourished. No distress.  HENT: Normocephalic. External right and left ear normal. Oropharynx is clear and moist.  Eyes: Conjunctivae and EOM are normal. PERRLA, no scleral icterus.  Neck: Normal ROM. Neck supple. No JVD. No tracheal deviation. No thyromegaly.  CVS: RRR, S1/S2 +, no murmurs, no gallops, no carotid bruit.  Pulmonary: Effort and breath sounds normal, no stridor, rhonchi, wheezes, rales.  Abdominal: Soft. BS +,  no distension, tenderness, rebound or guarding.  Musculoskeletal: Normal range of motion. No edema and no tenderness.  Lymphadenopathy: No lymphadenopathy noted, cervical, inguinal. Neuro: Alert. Normal reflexes, muscle tone coordination. No cranial nerve deficit. Skin:  Skin is warm and dry. No rash noted. Not diaphoretic. No erythema. No pallor.  Psychiatric: Normal mood and affect. Behavior, judgment, thought content normal.   Lab Results  Component Value Date   WBC 7.7  02/28/2013   HGB 14.5 02/28/2013   HCT 41.4 02/28/2013   MCV 80.5 02/28/2013   PLT 186 02/28/2013   Lab Results  Component Value Date   CREATININE 1.05 02/28/2013   BUN 12 02/28/2013   NA 135 02/28/2013   K 3.7 02/28/2013   CL 102 02/28/2013   CO2 23 02/28/2013    Lab Results  Component Value Date   HGBA1C 14.0* 02/28/2013   Lipid Panel     Component Value Date/Time   CHOL 191 02/28/2013 0430   TRIG 183* 02/28/2013 0430   HDL 35* 02/28/2013 0430   CHOLHDL 5.5 02/28/2013 0430   VLDL 37 02/28/2013 0430   LDLCALC 119* 02/28/2013 0430       Assessment and plan:   Patient Active Problem List   Diagnosis Date Noted  . Acute CVA (cerebrovascular accident) - patient appears to be doing well, we have discussed importance of continuation of physical therapy as well as medical compliance. I have encouraged him to continue checking blood pressure regularly and to call us back in the numbers are higher than 140/90  02/28/2013  . HTN (hypertension) with goal to be determined - patient reports compliance, I have encouraged him to continue taking medicine as prescribed and to check blood pressure regularly as noted above  02/28/2013  . Diabetes - we have discussed meaning of A1c, patient is aware that it is 14. He reports watching diet very closely, takes medicine as prescribed. I have explained that we need to check A1c in 3 months and if the numbers persistently elevated we'll have to consider insulin as an option.  02/28/2013

## 2013-04-13 ENCOUNTER — Telehealth: Payer: Self-pay

## 2013-04-13 NOTE — Telephone Encounter (Signed)
Refill for True Test test strips called in to [facility] at [place] [phone] 1 box of 50 with 2 additional refills

## 2013-04-17 ENCOUNTER — Ambulatory Visit: Payer: No Typology Code available for payment source | Attending: Internal Medicine | Admitting: Internal Medicine

## 2013-04-17 VITALS — BP 118/80 | HR 87 | Temp 98.1°F | Resp 17

## 2013-04-17 DIAGNOSIS — E785 Hyperlipidemia, unspecified: Secondary | ICD-10-CM

## 2013-04-17 MED ORDER — BLOOD GLUCOSE TEST VI STRP: 1.0000 "application " | ORAL_STRIP | Freq: Every day | Status: DC

## 2013-04-17 MED ORDER — ASPIRIN 325 MG PO TABS: 325.0000 mg | ORAL_TABLET | Freq: Every day | ORAL | Status: DC

## 2013-04-17 MED ORDER — SIMVASTATIN 20 MG PO TABS: 20.0000 mg | ORAL_TABLET | Freq: Every day | ORAL | Status: DC

## 2013-04-17 MED ORDER — METFORMIN HCL 850 MG PO TABS: 850.0000 mg | ORAL_TABLET | Freq: Two times a day (BID) | ORAL | Status: DC

## 2013-04-17 MED ORDER — LISINOPRIL-HYDROCHLOROTHIAZIDE 20-12.5 MG PO TABS: 1.0000 | ORAL_TABLET | Freq: Every day | ORAL | Status: DC

## 2013-04-17 MED ORDER — GLIMEPIRIDE 4 MG PO TABS: 4.0000 mg | ORAL_TABLET | Freq: Every day | ORAL | Status: DC

## 2013-04-17 NOTE — Progress Notes (Unsigned)
Patient here to have his medications adjusted States lopressor has not been keeping his B/P under control Would like to go back on lisinopril zestoretic he still has some and has Been taking them Needs script for his test strips for his machine

## 2013-04-17 NOTE — Progress Notes (Unsigned)
Patient ID: Vincent Osborne, male   DOB: 1965-07-20, 47 y.o.   MRN: [id number]  HPI 47 year old male with recent history of stroke and newly diagnosed diabetes here for followup. Patient reports that his blood pressure had been elevated and not improved with metoprolol that was given to him upon discharge from the hospital. He reports being on lisinopril he used to take previously. He reports being compliant to all the other medications. He has not out of his test strips and has not been able to monitor his blood glucose for the last 2 weeks. He has also been taking metformin 500 mg twice daily orally (was prescribed 1000 mg twice daily) as it causes him to be nauseous.. He has however started monitoring his diet. Denies any headache, dizziness, weakness, chest pain, palpitations, shortness of breath, polyuria, polydipsia, any numbness of his extremities.  Vital signs in last 24 hours:  Filed Vitals:   04/17/13 1708  BP: 118/80  Pulse: 87  Temp: 98.1 F (36.7 C)  Resp: 17  SpO2: 100%      Physical Exam:  General: Middle aged male in no acute distress. HEENT: no pallor, no icterus, moist oral mucosa,  Heart: Normal  s1 &s2  Regular rate and rhythm,  Lungs: Clear to auscultation bilaterally. Abdomen: Soft, nontender, nondistended, positive bowel sounds. Extremities: Warm, no edema Neuro: Alert, awake, oriented x3, nonfocal.   Lab Results:  Basic Metabolic Panel:    Component Value Date/Time   NA 135 02/28/2013 2030   K 3.7 02/28/2013 2030   CL 102 02/28/2013 2030   CO2 23 02/28/2013 2030   BUN 12 02/28/2013 2030   CREATININE 1.05 02/28/2013 2030   GLUCOSE 255* 02/28/2013 2030   CALCIUM 8.9 02/28/2013 2030   CBC:    Component Value Date/Time   WBC 7.7 02/28/2013 0430   HGB 14.5 02/28/2013 0430   HCT 41.4 02/28/2013 0430   PLT 186 02/28/2013 0430   MCV 80.5 02/28/2013 0430   NEUTROABS 3.4 02/27/2013 2225   LYMPHSABS 3.5 02/27/2013 2225   MONOABS 0.6 02/27/2013 2225   EOSABS 0.2 02/27/2013 2225   BASOSABS 0.0 02/27/2013 2225    No results found for this or any previous visit (from the past 240 hour(s)).  Studies/Results: No results found.  Medications: Scheduled Meds: Continuous Infusions: PRN Meds:.    Assessment/Plan:  Uncontrolled diabetes I would reduce his metformin to 50 mg twice a day as patient feels nauseous on thousand milligram twice a day dose. I have also added Amaryl 4 mg daily. We will repeat a hemoglobin A1c  next visit. Given prescription for test strips and asked patient to keep a close monitoring of his blood glucose at home.  Hypertension (Stable today. Patient not compliant with metoprolol needed I will switch him to HCTZ.-lisinopril 12.5-20 mg daily. Previous UA showed proteinuria.  Recent CVA No residual weakness . Continue aspirin for greater than milligram daily. Patient has hyperlipidemia but is not on statin. I will start him on Zocor 20 mg daily. Will repeat lipid panel in the next visit.  Counseled strongly on diet and medication compliance. Followup in 6 weeks.  Patient refused flu vaccine  Kohle Winner 04/17/2013, 5:19 PM

## 2013-04-17 NOTE — Progress Notes (Unsigned)
Patient declined flu vaccine.

## 2013-06-14 ENCOUNTER — Ambulatory Visit: Payer: Self-pay

## 2013-09-19 ENCOUNTER — Encounter: Payer: Self-pay | Admitting: Internal Medicine

## 2013-09-19 ENCOUNTER — Ambulatory Visit: Payer: No Typology Code available for payment source | Attending: Internal Medicine | Admitting: Internal Medicine

## 2013-09-19 VITALS — BP 145/95 | HR 94 | Temp 98.7°F | Resp 17 | Wt 211.4 lb

## 2013-09-19 DIAGNOSIS — K089 Disorder of teeth and supporting structures, unspecified: Secondary | ICD-10-CM

## 2013-09-19 DIAGNOSIS — Z794 Long term (current) use of insulin: Secondary | ICD-10-CM | POA: Insufficient documentation

## 2013-09-19 DIAGNOSIS — I10 Essential (primary) hypertension: Secondary | ICD-10-CM

## 2013-09-19 DIAGNOSIS — E119 Type 2 diabetes mellitus without complications: Secondary | ICD-10-CM | POA: Insufficient documentation

## 2013-09-19 DIAGNOSIS — M25562 Pain in left knee: Secondary | ICD-10-CM

## 2013-09-19 DIAGNOSIS — M25569 Pain in unspecified knee: Secondary | ICD-10-CM

## 2013-09-19 DIAGNOSIS — K0889 Other specified disorders of teeth and supporting structures: Secondary | ICD-10-CM

## 2013-09-19 DIAGNOSIS — E785 Hyperlipidemia, unspecified: Secondary | ICD-10-CM

## 2013-09-19 DIAGNOSIS — Z79899 Other long term (current) drug therapy: Secondary | ICD-10-CM | POA: Insufficient documentation

## 2013-09-19 DIAGNOSIS — K029 Dental caries, unspecified: Secondary | ICD-10-CM | POA: Insufficient documentation

## 2013-09-19 LAB — GLUCOSE, POCT (MANUAL RESULT ENTRY): POC GLUCOSE: 338 mg/dL — AB (ref 70–99)

## 2013-09-19 LAB — POCT GLYCOSYLATED HEMOGLOBIN (HGB A1C)

## 2013-09-19 MED ORDER — AMOXICILLIN 500 MG PO CAPS: 500.0000 mg | ORAL_CAPSULE | Freq: Three times a day (TID) | ORAL | Status: DC

## 2013-09-19 MED ORDER — FREESTYLE SYSTEM KIT: 1.0000 | PACK | Status: DC | PRN

## 2013-09-19 MED ORDER — SIMVASTATIN 20 MG PO TABS: 20.0000 mg | ORAL_TABLET | Freq: Every day | ORAL | Status: DC

## 2013-09-19 MED ORDER — LISINOPRIL-HYDROCHLOROTHIAZIDE 20-12.5 MG PO TABS: 1.0000 | ORAL_TABLET | Freq: Every day | ORAL | Status: DC

## 2013-09-19 MED ORDER — INSULIN ASPART 100 UNIT/ML ~~LOC~~ SOLN
10.0000 [IU] | Freq: Once | SUBCUTANEOUS | Status: AC
  Administered 2013-09-19: 10 [IU] via SUBCUTANEOUS

## 2013-09-19 MED ORDER — GLIMEPIRIDE 4 MG PO TABS: 4.0000 mg | ORAL_TABLET | Freq: Every day | ORAL | Status: DC

## 2013-09-19 MED ORDER — GLUCOSE BLOOD VI STRP: ORAL_STRIP | Status: DC

## 2013-09-19 MED ORDER — METFORMIN HCL 850 MG PO TABS: 850.0000 mg | ORAL_TABLET | Freq: Two times a day (BID) | ORAL | Status: DC

## 2013-09-19 NOTE — Patient Instructions (Addendum)
Diabetes Meal Planning Guide The diabetes meal planning guide is a tool to help you plan your meals and snacks. It is important for people with diabetes to manage their blood glucose (sugar) levels. Choosing the right foods and the right amounts throughout your day will help control your blood glucose. Eating right can even help you improve your blood pressure and reach or maintain a healthy weight. CARBOHYDRATE COUNTING MADE EASY When you eat carbohydrates, they turn to sugar. This raises your blood glucose level. Counting carbohydrates can help you control this level so you feel better. When you plan your meals by counting carbohydrates, you can have more flexibility in what you eat and balance your medicine with your food intake. Carbohydrate counting simply means adding up the total amount of carbohydrate grams in your meals and snacks. Try to eat about the same amount at each meal. Foods with carbohydrates are listed below. Each portion below is 1 carbohydrate serving or 15 grams of carbohydrates. Ask your dietician how many grams of carbohydrates you should eat at each meal or snack. Grains and Starches  1 slice bread.   English muffin or hotdog/hamburger bun.   cup cold cereal (unsweetened).   cup cooked pasta or rice.   cup starchy vegetables (corn, potatoes, peas, beans, winter squash).  1 tortilla (6 inches).   bagel.  1 waffle or pancake (size of a CD).   cup cooked cereal.  4 to 6 small crackers. *Whole grain is recommended. Fruit  1 cup fresh unsweetened berries, melon, papaya, pineapple.  1 small fresh fruit.   banana or mango.   cup fruit juice (4 oz unsweetened).   cup canned fruit in natural juice or water.  2 tbs dried fruit.  12 to 15 grapes or cherries. Milk and Yogurt  1 cup fat-free or 1% milk.  1 cup soy milk.  6 oz light yogurt with sugar-free sweetener.  6 oz low-fat soy yogurt.  6 oz plain yogurt. Vegetables  1 cup raw or  cup  cooked is counted as 0 carbohydrates or a "free" food.  If you eat 3 or more servings at 1 meal, count them as 1 carbohydrate serving. Other Carbohydrates   oz chips or pretzels.   cup ice cream or frozen yogurt.   cup sherbet or sorbet.  2 inch square cake, no frosting.  1 tbs honey, sugar, jam, jelly, or syrup.  2 small cookies.  3 squares of graham crackers.  3 cups popcorn.  6 crackers.  1 cup broth-based soup.  Count 1 cup casserole or other mixed foods as 2 carbohydrate servings.  Foods with less than 20 calories in a serving may be counted as 0 carbohydrates or a "free" food. You may want to purchase a book or computer software that lists the carbohydrate gram counts of different foods. In addition, the nutrition facts panel on the labels of the foods you eat are a good source of this information. The label will tell you how big the serving size is and the total number of carbohydrate grams you will be eating per serving. Divide this number by 15 to obtain the number of carbohydrate servings in a portion. Remember, 1 carbohydrate serving equals 15 grams of carbohydrate. SERVING SIZES Measuring foods and serving sizes helps you make sure you are getting the right amount of food. The list below tells how big or small some common serving sizes are.  1 oz.........4 stacked dice.  3 oz.........Deck of cards.  1 tsp........Tip   of little finger.  1 tbs........Thumb.  2 tbs........Golf ball.   cup.......Half of a fist.  1 cup........A fist. SAMPLE DIABETES MEAL PLAN Below is a sample meal plan that includes foods from the grain and starches, dairy, vegetable, fruit, and meat groups. A dietician can individualize a meal plan to fit your calorie needs and tell you the number of servings needed from each food group. However, controlling the total amount of carbohydrates in your meal or snack is more important than making sure you include all of the food groups at every  meal. You may interchange carbohydrate containing foods (dairy, starches, and fruits). The meal plan below is an example of a 2000 calorie diet using carbohydrate counting. This meal plan has 17 carbohydrate servings. Breakfast  1 cup oatmeal (2 carb servings).   cup light yogurt (1 carb serving).  1 cup blueberries (1 carb serving).   cup almonds. Snack  1 large apple (2 carb servings).  1 low-fat string cheese stick. Lunch  Chicken breast salad.  1 cup spinach.   cup chopped tomatoes.  2 oz chicken breast, sliced.  2 tbs low-fat Italian dressing.  12 whole-wheat crackers (2 carb servings).  12 to 15 grapes (1 carb serving).  1 cup low-fat milk (1 carb serving). Snack  1 cup carrots.   cup hummus (1 carb serving). Dinner  3 oz broiled salmon.  1 cup brown rice (3 carb servings). Snack  1  cups steamed broccoli (1 carb serving) drizzled with 1 tsp olive oil and lemon juice.  1 cup light pudding (2 carb servings). DIABETES MEAL PLANNING WORKSHEET Your dietician can use this worksheet to help you decide how many servings of foods and what types of foods are right for you.  BREAKFAST Food Group and Servings / Carb Servings Grain/Starches __________________________________ Dairy __________________________________________ Vegetable ______________________________________ Fruit ___________________________________________ Meat __________________________________________ Fat ____________________________________________ LUNCH Food Group and Servings / Carb Servings Grain/Starches ___________________________________ Dairy ___________________________________________ Fruit ____________________________________________ Meat ___________________________________________ Fat _____________________________________________ DINNER Food Group and Servings / Carb Servings Grain/Starches ___________________________________ Dairy  ___________________________________________ Fruit ____________________________________________ Meat ___________________________________________ Fat _____________________________________________ SNACKS Food Group and Servings / Carb Servings Grain/Starches ___________________________________ Dairy ___________________________________________ Vegetable _______________________________________ Fruit ____________________________________________ Meat ___________________________________________ Fat _____________________________________________ DAILY TOTALS Starches _________________________ Vegetable ________________________ Fruit ____________________________ Dairy ____________________________ Meat ____________________________ Fat ______________________________ Document Released: 03/11/2005 Document Revised: 09/06/2011 Document Reviewed: 01/20/2009 [employer] Patient Information 2014 [employer] DASH Diet The DASH diet stands for "Dietary Approaches to Stop Hypertension." It is a healthy eating plan that has been shown to reduce high blood pressure (hypertension) in as little as 14 days, while also possibly providing other significant health benefits. These other health benefits include reducing the risk of breast cancer after menopause and reducing the risk of type 2 diabetes, heart disease, colon cancer, and stroke. Health benefits also include weight loss and slowing kidney failure in patients with chronic kidney disease.  DIET GUIDELINES  Limit salt (sodium). Your diet should contain less than 1500 mg of sodium daily.  Limit refined or processed carbohydrates. Your diet should include mostly whole grains. Desserts and added sugars should be used sparingly.  Include small amounts of heart-healthy fats. These types of fats include nuts, oils, and tub margarine. Limit saturated and trans fats. These fats have been shown to be harmful in the body. CHOOSING FOODS  The following food groups  are based on a 2000 calorie diet. See your Registered Dietitian for individual calorie needs. Grains and Grain Products (6 to 8 servings daily)  Eat More Often:   Whole-wheat bread, brown rice, whole-grain or wheat pasta, quinoa, popcorn without added fat or salt (air popped).  Eat Less Often: White bread, white pasta, white rice, cornbread. Vegetables (4 to 5 servings daily)  Eat More Often: Fresh, frozen, and canned vegetables. Vegetables may be raw, steamed, roasted, or grilled with a minimal amount of fat.  Eat Less Often/Avoid: Creamed or fried vegetables. Vegetables in a cheese sauce. Fruit (4 to 5 servings daily)  Eat More Often: All fresh, canned (in natural juice), or frozen fruits. Dried fruits without added sugar. One hundred percent fruit juice ( cup [237 mL] daily).  Eat Less Often: Dried fruits with added sugar. Canned fruit in light or heavy syrup. Lean Meats, Fish, and Poultry (2 servings or less daily. One serving is 3 to 4 oz [85-114 g]).  Eat More Often: Ninety percent or leaner ground beef, tenderloin, sirloin. Round cuts of beef, chicken breast, turkey breast. All fish. Grill, bake, or broil your meat. Nothing should be fried.  Eat Less Often/Avoid: Fatty cuts of meat, turkey, or chicken leg, thigh, or wing. Fried cuts of meat or fish. Dairy (2 to 3 servings)  Eat More Often: Low-fat or fat-free milk, low-fat plain or light yogurt, reduced-fat or part-skim cheese.  Eat Less Often/Avoid: Milk (whole, 2%).Whole milk yogurt. Full-fat cheeses. Nuts, Seeds, and Legumes (4 to 5 servings per week)  Eat More Often: All without added salt.  Eat Less Often/Avoid: Salted nuts and seeds, canned beans with added salt. Fats and Sweets (limited)  Eat More Often: Vegetable oils, tub margarines without trans fats, sugar-free gelatin. Mayonnaise and salad dressings.  Eat Less Often/Avoid: Coconut oils, palm oils, butter, stick margarine, cream, half and half, cookies, candy,  pie. FOR MORE INFORMATION The Dash Diet Eating Plan: www.dashdiet.org Document Released: 06/03/2011 Document Revised: 09/06/2011 Document Reviewed: 06/03/2011 [employer] Patient Information 2014 [employer].  

## 2013-09-19 NOTE — Progress Notes (Signed)
Patient here for dental referral Saw orthopedic- needs MRI of left knee

## 2013-09-19 NOTE — Progress Notes (Signed)
MRN: [id number] Name: Vincent Osborne  Sex: male Age: 48 y.o. DOB: 1966-03-23  Allergies: Review of patient's allergies indicates no known allergies.  Chief Complaint  Patient presents with  . dental referral    HPI: Patient is 48 y.o. male who has history of diabetes, hypertension, comes today for followup complaining of dental pain and is requesting referral to see a dentist has lot of dental cavities, patient denies any hypoglycemic symptoms and has not been checking his blood sugar at home and requesting glucose monitor kit, initially his blood pressure was elevated, repeat manual blood pressure is 140/95 as per patient he has not taken his blood pressure medication today, patient has been only taking half the dose of metformin and did not start taking Amaryl which was prescribed on the last visit. Patient also requesting left knee MRI as per patient he saw his orthopedic surgeon and recommended to get an MRI done patient has the left knee pain for the last one year.  Past Medical History  Diagnosis Date  . Diabetes mellitus   . Hypertension   . Whiplash     04/2012    Past Surgical History  Procedure Laterality Date  . Appendectomy        Medication List       This list is accurate as of: 09/19/13 10:09 AM.  Always use your most recent med list.               amoxicillin 500 MG capsule  Commonly known as:  AMOXIL  Take 1 capsule (500 mg total) by mouth 3 (three) times daily.     aspirin 325 MG tablet  Take 1 tablet (325 mg total) by mouth daily.     BLOOD GLUCOSE TEST STRIPS Strp  1 application by In Vitro route daily.     glucose blood test strip  Use as instructed     glimepiride 4 MG tablet  Commonly known as:  AMARYL  Take 1 tablet (4 mg total) by mouth daily before breakfast.     glucose monitoring kit monitoring kit  1 each by Does not apply route as needed for other.     lisinopril-hydrochlorothiazide 20-12.5 MG per tablet  Commonly known as:   ZESTORETIC  Take 1 tablet by mouth daily.     metFORMIN 850 MG tablet  Commonly known as:  GLUCOPHAGE  Take 1 tablet (850 mg total) by mouth 2 (two) times daily with a meal.     NAPROXEN PO  Take 2 tablets by mouth daily as needed (pain).     simvastatin 20 MG tablet  Commonly known as:  ZOCOR  Take 1 tablet (20 mg total) by mouth at bedtime.        Meds ordered this encounter  Medications  . glucose monitoring kit (FREESTYLE) monitoring kit    Sig: 1 each by Does not apply route as needed for other.    Dispense:  1 each    Refill:  0  . glucose blood test strip    Sig: Use as instructed    Dispense:  100 each    Refill:  12  . insulin aspart (novoLOG) injection 10 Units    Sig:     Per protocol blood sugar 338  . glimepiride (AMARYL) 4 MG tablet    Sig: Take 1 tablet (4 mg total) by mouth daily before breakfast.    Dispense:  30 tablet    Refill:  3  . lisinopril-hydrochlorothiazide (ZESTORETIC)  20-12.5 MG per tablet    Sig: Take 1 tablet by mouth daily.    Dispense:  90 tablet    Refill:  3  . metFORMIN (GLUCOPHAGE) 850 MG tablet    Sig: Take 1 tablet (850 mg total) by mouth 2 (two) times daily with a meal.    Dispense:  60 tablet    Refill:  3  . simvastatin (ZOCOR) 20 MG tablet    Sig: Take 1 tablet (20 mg total) by mouth at bedtime.    Dispense:  90 tablet    Refill:  3  . amoxicillin (AMOXIL) 500 MG capsule    Sig: Take 1 capsule (500 mg total) by mouth 3 (three) times daily.    Dispense:  30 capsule    Refill:  0    There is no immunization history for the selected administration types on file for this patient.  Family History  Problem Relation Age of Onset  . Diabetes Mother   . Diabetes Brother   . Hypertension Maternal Uncle     History  Substance Use Topics  . Smoking status: Never Smoker   . Smokeless tobacco: Never Used  . Alcohol Use: No    Review of Systems   As noted in HPI  Filed Vitals:   09/19/13 1003  BP: 145/95  Pulse:     Temp:   Resp:     Physical Exam  Physical Exam  Constitutional: No distress.  HENT:  Head: Normocephalic and atraumatic.  Dental cavities   Eyes: EOM are normal. Pupils are equal, round, and reactive to light.  Cardiovascular: Normal rate and regular rhythm.   Pulmonary/Chest: Breath sounds normal. No respiratory distress. He has no wheezes. He has no rales.  Musculoskeletal: He exhibits no edema.  Left knee tenderness medially     CBC    Component Value Date/Time   WBC 7.7 02/28/2013 0430   RBC 5.14 02/28/2013 0430   HGB 14.5 02/28/2013 0430   HCT 41.4 02/28/2013 0430   PLT 186 02/28/2013 0430   MCV 80.5 02/28/2013 0430   LYMPHSABS 3.5 02/27/2013 2225   MONOABS 0.6 02/27/2013 2225   EOSABS 0.2 02/27/2013 2225   BASOSABS 0.0 02/27/2013 2225    CMP     Component Value Date/Time   NA 135 02/28/2013 2030   K 3.7 02/28/2013 2030   CL 102 02/28/2013 2030   CO2 23 02/28/2013 2030   GLUCOSE 255* 02/28/2013 2030   BUN 12 02/28/2013 2030   CREATININE 1.05 02/28/2013 2030   CALCIUM 8.9 02/28/2013 2030   PROT 6.2 02/28/2013 2030   ALBUMIN 3.2* 02/28/2013 2030   AST 15 02/28/2013 2030   ALT 13 02/28/2013 2030   ALKPHOS 50 02/28/2013 2030   BILITOT 0.4 02/28/2013 2030   GFRNONAA 82* 02/28/2013 2030   GFRAA >90 02/28/2013 2030    Lab Results  Component Value Date/Time   CHOL 191 02/28/2013  4:30 AM    No components found with this basename: hga1c    Lab Results  Component Value Date/Time   AST 15 02/28/2013  8:30 PM    Assessment and Plan  DM (diabetes mellitus) - Plan:  Results for orders placed in visit on 09/19/13  GLUCOSE, POCT (MANUAL RESULT ENTRY)      Result Value Ref Range   POC Glucose 338 (*) 70 - 99 mg/dl  POCT GLYCOSYLATED HEMOGLOBIN (HGB A1C)      Result Value Ref Range   Hemoglobin A1C =>14  patient does not want to take insulin at this point, I have counseled patient to take metformin full dose and also start taking Amaryl, keep the fingerstick log. Repeat A1c in 3 months, if needed start  on insulin. Glucose (CBG), HgB A1c, insulin aspart (novoLOG) injection 10 Units, glimepiride (AMARYL) 4 MG tablet, metFORMIN (GLUCOPHAGE) 850 MG tablet  HTN (hypertension) - Plan: Continue with her lisinopril-hydrochlorothiazide (ZESTORETIC) 20-12.5 MG per tablet  Other and unspecified hyperlipidemia - Plan: Continue with her simvastatin (ZOCOR) 20 MG tablet, repeat fasting lipid panel on next visit.    Pain, dental - Plan: amoxicillin (AMOXIL) 500 MG capsule, Ambulatory referral to Dentistry  Left knee pain - Plan: MR Knee Left  Wo Contrast, as per patient he has follow with  orthopedics in 2 weeks.   Return in about 3 months (around 12/20/2013) for diabetes, hypertension.  Lorayne Marek, MD

## 2013-10-04 ENCOUNTER — Ambulatory Visit (HOSPITAL_COMMUNITY)
Admission: RE | Admit: 2013-10-04 | Discharge: 2013-10-04 | Disposition: A | Discharge status: Home / Self Care | Payer: No Typology Code available for payment source | Source: Ambulatory Visit | Attending: Internal Medicine | Admitting: Internal Medicine

## 2013-10-04 DIAGNOSIS — M25562 Pain in left knee: Secondary | ICD-10-CM

## 2013-10-04 DIAGNOSIS — G8929 Other chronic pain: Secondary | ICD-10-CM | POA: Insufficient documentation

## 2013-10-04 DIAGNOSIS — M2240 Chondromalacia patellae, unspecified knee: Secondary | ICD-10-CM | POA: Insufficient documentation

## 2013-10-04 DIAGNOSIS — M25569 Pain in unspecified knee: Secondary | ICD-10-CM | POA: Insufficient documentation

## 2013-12-20 ENCOUNTER — Ambulatory Visit: Payer: No Typology Code available for payment source | Attending: Internal Medicine | Admitting: Internal Medicine

## 2013-12-20 ENCOUNTER — Encounter: Payer: Self-pay | Admitting: Internal Medicine

## 2013-12-20 VITALS — BP 127/88 | HR 112 | Temp 98.6°F | Resp 16 | Ht 74.0 in | Wt 208.0 lb

## 2013-12-20 DIAGNOSIS — I10 Essential (primary) hypertension: Secondary | ICD-10-CM

## 2013-12-20 DIAGNOSIS — E119 Type 2 diabetes mellitus without complications: Secondary | ICD-10-CM

## 2013-12-20 LAB — POCT GLYCOSYLATED HEMOGLOBIN (HGB A1C): Hemoglobin A1C: 13.4

## 2013-12-20 LAB — GLUCOSE, POCT (MANUAL RESULT ENTRY)
POC GLUCOSE: 345 mg/dL — AB (ref 70–99)
POC GLUCOSE: 375 mg/dL — AB (ref 70–99)

## 2013-12-20 MED ORDER — DAPAGLIFLOZIN PROPANEDIOL 10 MG PO TABS: 10.0000 mg | ORAL_TABLET | Freq: Every day | ORAL | Status: DC

## 2013-12-20 MED ORDER — METFORMIN HCL 1000 MG PO TABS: 1000.0000 mg | ORAL_TABLET | Freq: Two times a day (BID) | ORAL | Status: DC

## 2013-12-20 NOTE — Progress Notes (Signed)
Pt is here today following up on his HTN and diabetes. Pt reports having tingling and numbness in his feet. Pt states that he has a knott on his right lower abdomen.

## 2013-12-20 NOTE — Patient Instructions (Signed)
Diabetes Mellitus and Food It is important for you to manage your blood sugar (glucose) level. Your blood glucose level can be greatly affected by what you eat. Eating healthier foods in the appropriate amounts throughout the day at about the same time each day will help you control your blood glucose level. It can also help slow or prevent worsening of your diabetes mellitus. Healthy eating may even help you improve the level of your blood pressure and reach or maintain a healthy weight.  HOW CAN FOOD AFFECT ME? Carbohydrates Carbohydrates affect your blood glucose level more than any other type of food. Your dietitian will help you determine how many carbohydrates to eat at each meal and teach you how to count carbohydrates. Counting carbohydrates is important to keep your blood glucose at a healthy level, especially if you are using insulin or taking certain medicines for diabetes mellitus. Alcohol Alcohol can cause sudden decreases in blood glucose (hypoglycemia), especially if you use insulin or take certain medicines for diabetes mellitus. Hypoglycemia can be a life-threatening condition. Symptoms of hypoglycemia (sleepiness, dizziness, and disorientation) are similar to symptoms of having too much alcohol.  If your health care provider has given you approval to drink alcohol, do so in moderation and use the following guidelines:  Women should not have more than one drink per day, and men should not have more than two drinks per day. One drink is equal to:  12 oz of beer.  5 oz of wine.  1 oz of hard liquor.  Do not drink on an empty stomach.  Keep yourself hydrated. Have water, diet soda, or unsweetened iced tea.  Regular soda, juice, and other mixers might contain a lot of carbohydrates and should be counted. WHAT FOODS ARE NOT RECOMMENDED? As you make food choices, it is important to remember that all foods are not the same. Some foods have fewer nutrients per serving than other  foods, even though they might have the same number of calories or carbohydrates. It is difficult to get your body what it needs when you eat foods with fewer nutrients. Examples of foods that you should avoid that are high in calories and carbohydrates but low in nutrients include:  Trans fats (most processed foods list trans fats on the Nutrition Facts label).  Regular soda.  Juice.  Candy.  Sweets, such as cake, pie, doughnuts, and cookies.  Fried foods. WHAT FOODS CAN I EAT? Have nutrient-rich foods, which will nourish your body and keep you healthy. The food you should eat also will depend on several factors, including:  The calories you need.  The medicines you take.  Your weight.  Your blood glucose level.  Your blood pressure level.  Your cholesterol level. You also should eat a variety of foods, including:  Protein, such as meat, poultry, fish, tofu, nuts, and seeds (lean animal proteins are best).  Fruits.  Vegetables.  Dairy products, such as milk, cheese, and yogurt (low fat is best).  Breads, grains, pasta, cereal, rice, and beans.  Fats such as olive oil, trans fat-free margarine, canola oil, avocado, and olives. DOES EVERYONE WITH DIABETES MELLITUS HAVE THE SAME MEAL PLAN? Because every person with diabetes mellitus is different, there is not one meal plan that works for everyone. It is very important that you meet with a dietitian who will help you create a meal plan that is just right for you. Document Released: 03/11/2005 Document Revised: 06/19/2013 Document Reviewed: 05/11/2013 ExitCare Patient Information 2015 [employer]. This   information is not intended to replace advice given to you by your health care provider. Make sure you discuss any questions you have with your health care provider. Hypertension Hypertension, commonly called high blood pressure, is when the force of blood pumping through your arteries is too strong. Your arteries are the  blood vessels that carry blood from your heart throughout your body. A blood pressure reading consists of a higher number over a lower number, such as 110/72. The higher number (systolic) is the pressure inside your arteries when your heart pumps. The lower number (diastolic) is the pressure inside your arteries when your heart relaxes. Ideally you want your blood pressure below 120/80. Hypertension forces your heart to work harder to pump blood. Your arteries may become narrow or stiff. Having hypertension puts you at risk for heart disease, stroke, and other problems.  RISK FACTORS Some risk factors for high blood pressure are controllable. Others are not.  Risk factors you cannot control include:   Race. You may be at higher risk if you are African American.  Age. Risk increases with age.  Gender. Men are at higher risk than women before age 49 years. After age 26, women are at higher risk than men. Risk factors you can control include:  Not getting enough exercise or physical activity.  Being overweight.  Getting too much fat, sugar, calories, or salt in your diet.  Drinking too much alcohol. SIGNS AND SYMPTOMS Hypertension does not usually cause signs or symptoms. Extremely high blood pressure (hypertensive crisis) may cause headache, anxiety, shortness of breath, and nosebleed. DIAGNOSIS  To check if you have hypertension, your health care provider will measure your blood pressure while you are seated, with your arm held at the level of your heart. It should be measured at least twice using the same arm. Certain conditions can cause a difference in blood pressure between your right and left arms. A blood pressure reading that is higher than normal on one occasion does not mean that you need treatment. If one blood pressure reading is high, ask your health care provider about having it checked again. TREATMENT  Treating high blood pressure includes making lifestyle changes and possibly  taking medication. Living a healthy lifestyle can help lower high blood pressure. You may need to change some of your habits. Lifestyle changes may include:  Following the DASH diet. This diet is high in fruits, vegetables, and whole grains. It is low in salt, red meat, and added sugars.  Getting at least 2 1/2 hours of brisk physical activity every week.  Losing weight if necessary.  Not smoking.  Limiting alcoholic beverages.  Learning ways to reduce stress. If lifestyle changes are not enough to get your blood pressure under control, your health care provider may prescribe medicine. You may need to take more than one. Work closely with your health care provider to understand the risks and benefits. HOME CARE INSTRUCTIONS  Have your blood pressure rechecked as directed by your health care provider.   Only take medicine as directed by your health care provider. Follow the directions carefully. Blood pressure medicines must be taken as prescribed. The medicine does not work as well when you skip doses. Skipping doses also puts you at risk for problems.   Do not smoke.   Monitor your blood pressure at home as directed by your health care provider. SEEK MEDICAL CARE IF:   You think you are having a reaction to medicines taken.  You have recurrent headaches  or feel dizzy.  You have swelling in your ankles.  You have trouble with your vision. SEEK IMMEDIATE MEDICAL CARE IF:  You develop a severe headache or confusion.  You have unusual weakness, numbness, or feel faint.  You have severe chest or abdominal pain.  You vomit repeatedly.  You have trouble breathing. MAKE SURE YOU:   Understand these instructions.  Will watch your condition.  Will get help right away if you are not doing well or get worse. Document Released: 06/14/2005 Document Revised: 06/19/2013 Document Reviewed: 04/06/2013 [facility] Patient Information 2015 [employer] [place]. This information is not  intended to replace advice given to you by your health care provider. Make sure you discuss any questions you have with your health care provider. Diabetes and Exercise Exercising regularly is important. It is not just about losing weight. It has many health benefits, such as:  Improving your overall fitness, flexibility, and endurance.  Increasing your bone density.  Helping with weight control.  Decreasing your body fat.  Increasing your muscle strength.  Reducing stress and tension.  Improving your overall health. People with diabetes who exercise gain additional benefits because exercise:  Reduces appetite.  Improves the body's use of blood sugar (glucose).  Helps lower or control blood glucose.  Decreases blood pressure.  Helps control blood lipids (such as cholesterol and triglycerides).  Improves the body's use of the hormone insulin by:  Increasing the body's insulin sensitivity.  Reducing the body's insulin needs.  Decreases the risk for heart disease because exercising:  Lowers cholesterol and triglycerides levels.  Increases the levels of good cholesterol (such as high-density lipoproteins [HDL]) in the body.  Lowers blood glucose levels. YOUR ACTIVITY PLAN  Choose an activity that you enjoy and set realistic goals. Your health care provider or diabetes educator can help you make an activity plan that works for you. You can break activities into 2 or 3 sessions throughout the day. Doing so is as good as one long session. Exercise ideas include:  Taking the dog for a walk.  Taking the stairs instead of the elevator.  Dancing to your favorite song.  Doing your favorite exercise with a friend. RECOMMENDATIONS FOR EXERCISING WITH TYPE 1 OR TYPE 2 DIABETES   Check your blood glucose before exercising. If blood glucose levels are greater than 240 mg/dL, check for urine ketones. Do not exercise if ketones are present.  Avoid injecting insulin into areas of  the body that are going to be exercised. For example, avoid injecting insulin into:  The arms when playing tennis.  The legs when jogging.  Keep a record of:  Food intake before and after you exercise.  Expected peak times of insulin action.  Blood glucose levels before and after you exercise.  The type and amount of exercise you have done.  Review your records with your health care provider. Your health care provider will help you to develop guidelines for adjusting food intake and insulin amounts before and after exercising.  If you take insulin or oral hypoglycemic agents, watch for signs and symptoms of hypoglycemia. They include:  Dizziness.  Shaking.  Sweating.  Chills.  Confusion.  Drink plenty of water while you exercise to prevent dehydration or heat stroke. Body water is lost during exercise and must be replaced.  Talk to your health care provider before starting an exercise program to make sure it is safe for you. Remember, almost any type of activity is better than none. Document Released: 09/04/2003 Document Revised:  02/14/2013 Document Reviewed: 11/21/2012 ExitCare Patient Information 2015 [place], [place]. This information is not intended to replace advice given to you by your health care provider. Make sure you discuss any questions you have with your health care provider.

## 2013-12-20 NOTE — Progress Notes (Signed)
Patient ID: Vincent Osborne, male   DOB: 1966-01-21, 48 y.o.   MRN: [id number]   Vincent Osborne, is a 48 y.o. male  GTX:[id number]  [id number]:[id number]  DOB - 1966/05/04  Chief Complaint  Patient presents with  . Follow-up        Subjective:   Vincent Osborne is a 48 y.o. male here today for a follow up visit. Pt is here today following up on his HTN and diabetes. Pt reports having tingling and numbness in his feet. Pt states that he has a knott on his right lower abdomen. Patient has No headache, No chest pain, No abdominal pain - No Nausea, No new weakness tingling or numbness, No Cough - SOB.  Problem  Essential Hypertension    ALLERGIES: No Known Allergies  PAST MEDICAL HISTORY: Past Medical History  Diagnosis Date  . Diabetes mellitus   . Hypertension   . Whiplash     04/2012    MEDICATIONS AT HOME: Prior to Admission medications   Medication Sig Start Date End Date Taking? Authorizing Provider  aspirin 325 MG tablet Take 1 tablet (325 mg total) by mouth daily. 04/17/13  Yes Nishant Dhungel, MD  Glucose Blood (BLOOD GLUCOSE TEST STRIPS) STRP 1 application by In Vitro route daily. 04/17/13  Yes Nishant Dhungel, MD  glucose blood test strip Use as instructed 09/19/13  Yes Deepak Advani, MD  glucose monitoring kit (FREESTYLE) monitoring kit 1 each by Does not apply route as needed for other. 09/19/13  Yes Lorayne Marek, MD  lisinopril-hydrochlorothiazide (ZESTORETIC) 20-12.5 MG per tablet Take 1 tablet by mouth daily. 09/19/13  Yes Lorayne Marek, MD  metFORMIN (GLUCOPHAGE) 1000 MG tablet Take 1 tablet (1,000 mg total) by mouth 2 (two) times daily with a meal. 12/20/13  Yes Angelica Chessman, MD  amoxicillin (AMOXIL) 500 MG capsule Take 1 capsule (500 mg total) by mouth 3 (three) times daily. 09/19/13   Lorayne Marek, MD  Dapagliflozin Propanediol (FARXIGA) 10 MG TABS Take 10 mg by mouth daily. 12/20/13   Angelica Chessman, MD  NAPROXEN PO Take 2 tablets by mouth daily as needed (pain).     Historical Provider, MD  simvastatin (ZOCOR) 20 MG tablet Take 1 tablet (20 mg total) by mouth at bedtime. 09/19/13   Lorayne Marek, MD     Objective:   Filed Vitals:   12/20/13 1529  BP: 127/88  Pulse: 112  Temp: 98.6 F (37 C)  TempSrc: Oral  Resp: 16  Height: _0  (1.88 m)  Weight: 208 lb (94.348 kg)  SpO2: 94%    Exam General appearance : Awake, alert, not in any distress. Speech Clear. Not toxic looking HEENT: Atraumatic and Normocephalic, pupils equally reactive to light and accomodation Neck: supple, no JVD. No cervical lymphadenopathy.  Chest:Good air entry bilaterally, no added sounds  CVS: S1 S2 regular, no murmurs.  Abdomen: Bowel sounds present, Non tender and not distended with no gaurding, rigidity or rebound. Extremities: B/L Lower Ext shows no edema, both legs are warm to touch Neurology: Awake alert, and oriented X 3, CN II-XII intact, Non focal  Data Review Lab Results  Component Value Date   HGBA1C 13.4 12/20/2013   HGBA1C =>14 09/19/2013   HGBA1C 14.0* 02/28/2013     Assessment & Plan   1. Type 2 diabetes mellitus without complication  - Glucose (CBG) - HgB A1c  - metFORMIN (GLUCOPHAGE) 1000 MG tablet; Take 1 tablet (1,000 mg total) by mouth 2 (two) times daily with a meal.  Dispense: 180  tablet; Refill: 3 Add - Dapagliflozin Propanediol (FARXIGA) 10 MG TABS; Take 10 mg by mouth daily.  Dispense: 30 tablet; Refill: 3  - Ambulatory referral to diabetic education - Ambulatory referral to Podiatry - Ambulatory referral to Ophthalmology   Aim for 2-3 Carb Choices per meal (30-45 grams) +/- 1 either way  Aim for 0-15 Carbs per snack if hungry  Include protein in moderation with your meals and snacks  Consider reading food labels for Total Carbohydrate and Fat Grams of foods  Consider checking BG at alternate times per day  Continue taking medication as directed Fruit Punch - find one with no sugar  Measure and decrease portions of carbohydrate  foods  Make your plate and don't go back for seconds   2. Essential hypertension: Controlled  Continue current medications DASH Diet  Patient was counseled extensively about nutrition and exercise  Return in about 3 months (around 03/22/2014), or if symptoms worsen or fail to improve, for Hemoglobin A1C and Follow up, DM, Follow up HTN.  The patient was given clear instructions to go to ER or return to medical center if symptoms don't improve, worsen or new problems develop. The patient verbalized understanding. The patient was told to call to get lab results if they haven't heard anything in the next week.   This note has been created with Surveyor, quantity. Any transcriptional errors are unintentional.    Angelica Chessman, MD, [place], [place], [place] [facility] [place], [place]   12/20/2013, 4:14 PM

## 2013-12-24 ENCOUNTER — Ambulatory Visit (INDEPENDENT_AMBULATORY_CARE_PROVIDER_SITE_OTHER): Payer: No Typology Code available for payment source | Admitting: Home Health Services

## 2013-12-24 DIAGNOSIS — E118 Type 2 diabetes mellitus with unspecified complications: Secondary | ICD-10-CM

## 2013-12-24 NOTE — Progress Notes (Signed)
DIABETES Pt came in to have a retinal scan per diabetic care.   Image was taken and submitted to [facility]. Garg for reading.    Results will be available in 1-2 weeks.  Results will be given to PCP for review and to contact patient.  Vincent Osborne  

## 2014-01-01 ENCOUNTER — Encounter: Payer: Self-pay | Admitting: Home Health Services

## 2014-01-01 DIAGNOSIS — E113299 Type 2 diabetes mellitus with mild nonproliferative diabetic retinopathy without macular edema, unspecified eye: Secondary | ICD-10-CM | POA: Insufficient documentation

## 2014-01-01 DIAGNOSIS — E11329 Type 2 diabetes mellitus with mild nonproliferative diabetic retinopathy without macular edema: Secondary | ICD-10-CM | POA: Insufficient documentation

## 2014-02-16 ENCOUNTER — Emergency Department (HOSPITAL_COMMUNITY)
Admission: EM | Admit: 2014-02-16 | Discharge: 2014-02-16 | Disposition: A | Discharge status: Home / Self Care | Payer: No Typology Code available for payment source | Attending: Emergency Medicine | Admitting: Emergency Medicine

## 2014-02-16 ENCOUNTER — Emergency Department (HOSPITAL_COMMUNITY): Payer: No Typology Code available for payment source

## 2014-02-16 ENCOUNTER — Encounter (HOSPITAL_COMMUNITY): Payer: Self-pay | Admitting: Emergency Medicine

## 2014-02-16 DIAGNOSIS — M545 Low back pain, unspecified: Secondary | ICD-10-CM

## 2014-02-16 DIAGNOSIS — Z7982 Long term (current) use of aspirin: Secondary | ICD-10-CM | POA: Insufficient documentation

## 2014-02-16 DIAGNOSIS — Y92410 Unspecified street and highway as the place of occurrence of the external cause: Secondary | ICD-10-CM | POA: Diagnosis not present

## 2014-02-16 DIAGNOSIS — E119 Type 2 diabetes mellitus without complications: Secondary | ICD-10-CM | POA: Diagnosis not present

## 2014-02-16 DIAGNOSIS — S8990XA Unspecified injury of unspecified lower leg, initial encounter: Secondary | ICD-10-CM | POA: Insufficient documentation

## 2014-02-16 DIAGNOSIS — S7002XA Contusion of left hip, initial encounter: Secondary | ICD-10-CM

## 2014-02-16 DIAGNOSIS — S7000XA Contusion of unspecified hip, initial encounter: Secondary | ICD-10-CM | POA: Insufficient documentation

## 2014-02-16 DIAGNOSIS — S79919A Unspecified injury of unspecified hip, initial encounter: Secondary | ICD-10-CM | POA: Diagnosis present

## 2014-02-16 DIAGNOSIS — Z792 Long term (current) use of antibiotics: Secondary | ICD-10-CM | POA: Diagnosis not present

## 2014-02-16 DIAGNOSIS — Z79899 Other long term (current) drug therapy: Secondary | ICD-10-CM | POA: Insufficient documentation

## 2014-02-16 DIAGNOSIS — Y9389 Activity, other specified: Secondary | ICD-10-CM | POA: Insufficient documentation

## 2014-02-16 DIAGNOSIS — M25562 Pain in left knee: Secondary | ICD-10-CM

## 2014-02-16 DIAGNOSIS — S99919A Unspecified injury of unspecified ankle, initial encounter: Secondary | ICD-10-CM

## 2014-02-16 DIAGNOSIS — IMO0002 Reserved for concepts with insufficient information to code with codable children: Secondary | ICD-10-CM | POA: Diagnosis not present

## 2014-02-16 DIAGNOSIS — I10 Essential (primary) hypertension: Secondary | ICD-10-CM | POA: Diagnosis not present

## 2014-02-16 DIAGNOSIS — S79929A Unspecified injury of unspecified thigh, initial encounter: Secondary | ICD-10-CM

## 2014-02-16 DIAGNOSIS — S99929A Unspecified injury of unspecified foot, initial encounter: Secondary | ICD-10-CM

## 2014-02-16 MED ORDER — NAPROXEN 500 MG PO TABS: 500.0000 mg | ORAL_TABLET | Freq: Two times a day (BID) | ORAL | Status: DC

## 2014-02-16 MED ORDER — NAPROXEN 500 MG PO TABS
500.0000 mg | ORAL_TABLET | Freq: Once | ORAL | Status: AC
  Administered 2014-02-16: 500 mg via ORAL
  Filled 2014-02-16: qty 1

## 2014-02-16 NOTE — Discharge Instructions (Signed)

## 2014-02-16 NOTE — ED Notes (Signed)
Dr. Docherty at bedside.
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
## 2014-03-26 ENCOUNTER — Encounter: Payer: Self-pay | Admitting: Internal Medicine

## 2014-03-26 ENCOUNTER — Ambulatory Visit: Payer: Self-pay | Attending: Internal Medicine | Admitting: Internal Medicine

## 2014-03-26 VITALS — BP 160/101 | HR 87 | Temp 98.3°F | Resp 16 | Ht 72.0 in | Wt 204.2 lb

## 2014-03-26 DIAGNOSIS — B351 Tinea unguium: Secondary | ICD-10-CM | POA: Insufficient documentation

## 2014-03-26 DIAGNOSIS — L02435 Carbuncle of right lower limb: Secondary | ICD-10-CM | POA: Insufficient documentation

## 2014-03-26 DIAGNOSIS — R739 Hyperglycemia, unspecified: Secondary | ICD-10-CM

## 2014-03-26 DIAGNOSIS — R21 Rash and other nonspecific skin eruption: Secondary | ICD-10-CM | POA: Insufficient documentation

## 2014-03-26 DIAGNOSIS — E119 Type 2 diabetes mellitus without complications: Secondary | ICD-10-CM | POA: Insufficient documentation

## 2014-03-26 DIAGNOSIS — Z791 Long term (current) use of non-steroidal anti-inflammatories (NSAID): Secondary | ICD-10-CM | POA: Insufficient documentation

## 2014-03-26 DIAGNOSIS — R7309 Other abnormal glucose: Secondary | ICD-10-CM

## 2014-03-26 DIAGNOSIS — Z792 Long term (current) use of antibiotics: Secondary | ICD-10-CM | POA: Insufficient documentation

## 2014-03-26 DIAGNOSIS — I10 Essential (primary) hypertension: Secondary | ICD-10-CM | POA: Insufficient documentation

## 2014-03-26 DIAGNOSIS — L02429 Furuncle of limb, unspecified: Secondary | ICD-10-CM | POA: Insufficient documentation

## 2014-03-26 LAB — GLUCOSE, POCT (MANUAL RESULT ENTRY)
POC GLUCOSE: 339 mg/dL — AB (ref 70–99)
POC Glucose: 443 mg/dl — AB (ref 70–99)

## 2014-03-26 MED ORDER — TERBINAFINE HCL 1 % EX CREA: 1.0000 "application " | TOPICAL_CREAM | Freq: Two times a day (BID) | CUTANEOUS | Status: DC

## 2014-03-26 MED ORDER — ASPIRIN 325 MG PO TABS: 325.0000 mg | ORAL_TABLET | Freq: Every day | ORAL | Status: DC

## 2014-03-26 MED ORDER — TERBINAFINE HCL 250 MG PO TABS: 250.0000 mg | ORAL_TABLET | Freq: Every day | ORAL | Status: DC

## 2014-03-26 MED ORDER — LISINOPRIL-HYDROCHLOROTHIAZIDE 20-12.5 MG PO TABS: 1.0000 | ORAL_TABLET | Freq: Every day | ORAL | Status: DC

## 2014-03-26 MED ORDER — DAPAGLIFLOZIN PROPANEDIOL 10 MG PO TABS: 10.0000 mg | ORAL_TABLET | Freq: Every day | ORAL | Status: DC

## 2014-03-26 MED ORDER — SULFAMETHOXAZOLE-TMP DS 800-160 MG PO TABS
1.0000 | ORAL_TABLET | Freq: Two times a day (BID) | ORAL | Status: DC
Start: 2014-03-26 — End: 2014-04-10
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
## 2014-03-26 NOTE — Progress Notes (Signed)
Patient here for follow-up Patient complains of a swollen, red spot on his inner upper thigh that has been there for four days and has a pain score of 10 Patient complains of an itchy rash on buttocks in between each cheek thats been there for more than 1 year Patient request lamosil for toenails and feet pain

## 2014-03-26 NOTE — Patient Instructions (Signed)
Hypertension Hypertension, commonly called high blood pressure, is when the force of blood pumping through your arteries is too strong. Your arteries are the blood vessels that carry blood from your heart throughout your body. A blood pressure reading consists of a higher number over a lower number, such as 110/72. The higher number (systolic) is the pressure inside your arteries when your heart pumps. The lower number (diastolic) is the pressure inside your arteries when your heart relaxes. Ideally you want your blood pressure below 120/80. Hypertension forces your heart to work harder to pump blood. Your arteries may become narrow or stiff. Having hypertension puts you at risk for heart disease, stroke, and other problems.  RISK FACTORS Some risk factors for high blood pressure are controllable. Others are not.  Risk factors you cannot control include:   Race. You may be at higher risk if you are African American.  Age. Risk increases with age.  Gender. Men are at higher risk than women before age 45 years. After age 65, women are at higher risk than men. Risk factors you can control include:  Not getting enough exercise or physical activity.  Being overweight.  Getting too much fat, sugar, calories, or salt in your diet.  Drinking too much alcohol. SIGNS AND SYMPTOMS Hypertension does not usually cause signs or symptoms. Extremely high blood pressure (hypertensive crisis) may cause headache, anxiety, shortness of breath, and nosebleed. DIAGNOSIS  To check if you have hypertension, your health care provider will measure your blood pressure while you are seated, with your arm held at the level of your heart. It should be measured at least twice using the same arm. Certain conditions can cause a difference in blood pressure between your right and left arms. A blood pressure reading that is higher than normal on one occasion does not mean that you need treatment. If one blood pressure reading  is high, ask your health care provider about having it checked again. TREATMENT  Treating high blood pressure includes making lifestyle changes and possibly taking medicine. Living a healthy lifestyle can help lower high blood pressure. You may need to change some of your habits. Lifestyle changes may include:  Following the DASH diet. This diet is high in fruits, vegetables, and whole grains. It is low in salt, red meat, and added sugars.  Getting at least 2 hours of brisk physical activity every week.  Losing weight if necessary.  Not smoking.  Limiting alcoholic beverages.  Learning ways to reduce stress. If lifestyle changes are not enough to get your blood pressure under control, your health care provider may prescribe medicine. You may need to take more than one. Work closely with your health care provider to understand the risks and benefits. HOME CARE INSTRUCTIONS  Have your blood pressure rechecked as directed by your health care provider.   Take medicines only as directed by your health care provider. Follow the directions carefully. Blood pressure medicines must be taken as prescribed. The medicine does not work as well when you skip doses. Skipping doses also puts you at risk for problems.   Do not smoke.   Monitor your blood pressure at home as directed by your health care provider. SEEK MEDICAL CARE IF:   You think you are having a reaction to medicines taken.  You have recurrent headaches or feel dizzy.  You have swelling in your ankles.  You have trouble with your vision. SEEK IMMEDIATE MEDICAL CARE IF:  You develop a severe headache or confusion.    You have unusual weakness, numbness, or feel faint.  You have severe chest or abdominal pain.  You vomit repeatedly.  You have trouble breathing. MAKE SURE YOU:   Understand these instructions.  Will watch your condition.  Will get help right away if you are not doing well or get worse. Document  Released: 06/14/2005 Document Revised: 10/29/2013 Document Reviewed: 04/06/2013 ExitCare Patient Information 2015 [employer]. This information is not intended to replace advice given to you by your health care provider. Make sure you discuss any questions you have with your health care provider. Diabetes Mellitus and Food It is important for you to manage your blood sugar (glucose) level. Your blood glucose level can be greatly affected by what you eat. Eating healthier foods in the appropriate amounts throughout the day at about the same time each day will help you control your blood glucose level. It can also help slow or prevent worsening of your diabetes mellitus. Healthy eating may even help you improve the level of your blood pressure and reach or maintain a healthy weight.  HOW CAN FOOD AFFECT ME? Carbohydrates Carbohydrates affect your blood glucose level more than any other type of food. Your dietitian will help you determine how many carbohydrates to eat at each meal and teach you how to count carbohydrates. Counting carbohydrates is important to keep your blood glucose at a healthy level, especially if you are using insulin or taking certain medicines for diabetes mellitus. Alcohol Alcohol can cause sudden decreases in blood glucose (hypoglycemia), especially if you use insulin or take certain medicines for diabetes mellitus. Hypoglycemia can be a life-threatening condition. Symptoms of hypoglycemia (sleepiness, dizziness, and disorientation) are similar to symptoms of having too much alcohol.  If your health care provider has given you approval to drink alcohol, do so in moderation and use the following guidelines:  Women should not have more than one drink per day, and men should not have more than two drinks per day. One drink is equal to:  12 oz of beer.  5 oz of wine.  1 oz of hard liquor.  Do not drink on an empty stomach.  Keep yourself hydrated. Have water, diet soda, or  unsweetened iced tea.  Regular soda, juice, and other mixers might contain a lot of carbohydrates and should be counted. WHAT FOODS ARE NOT RECOMMENDED? As you make food choices, it is important to remember that all foods are not the same. Some foods have fewer nutrients per serving than other foods, even though they might have the same number of calories or carbohydrates. It is difficult to get your body what it needs when you eat foods with fewer nutrients. Examples of foods that you should avoid that are high in calories and carbohydrates but low in nutrients include:  Trans fats (most processed foods list trans fats on the Nutrition Facts label).  Regular soda.  Juice.  Candy.  Sweets, such as cake, pie, doughnuts, and cookies.  Fried foods. WHAT FOODS CAN I EAT? Have nutrient-rich foods, which will nourish your body and keep you healthy. The food you should eat also will depend on several factors, including:  The calories you need.  The medicines you take.  Your weight.  Your blood glucose level.  Your blood pressure level.  Your cholesterol level. You also should eat a variety of foods, including:  Protein, such as meat, poultry, fish, tofu, nuts, and seeds (lean animal proteins are best).  Fruits.  Vegetables.  Dairy products, such as   milk, cheese, and yogurt (low fat is best).  Breads, grains, pasta, cereal, rice, and beans.  Fats such as olive oil, trans fat-free margarine, canola oil, avocado, and olives. DOES EVERYONE WITH DIABETES MELLITUS HAVE THE SAME MEAL PLAN? Because every person with diabetes mellitus is different, there is not one meal plan that works for everyone. It is very important that you meet with a dietitian who will help you create a meal plan that is just right for you. Document Released: 03/11/2005 Document Revised: 06/19/2013 Document Reviewed: 05/11/2013 ExitCare Patient Information 2015 [employer]. This information is not intended  to replace advice given to you by your health care provider. Make sure you discuss any questions you have with your health care provider. Diabetes and Exercise Exercising regularly is important. It is not just about losing weight. It has many health benefits, such as:  Improving your overall fitness, flexibility, and endurance.  Increasing your bone density.  Helping with weight control.  Decreasing your body fat.  Increasing your muscle strength.  Reducing stress and tension.  Improving your overall health. People with diabetes who exercise gain additional benefits because exercise:  Reduces appetite.  Improves the body's use of blood sugar (glucose).  Helps lower or control blood glucose.  Decreases blood pressure.  Helps control blood lipids (such as cholesterol and triglycerides).  Improves the body's use of the hormone insulin by:  Increasing the body's insulin sensitivity.  Reducing the body's insulin needs.  Decreases the risk for heart disease because exercising:  Lowers cholesterol and triglycerides levels.  Increases the levels of good cholesterol (such as high-density lipoproteins [HDL]) in the body.  Lowers blood glucose levels. YOUR ACTIVITY PLAN  Choose an activity that you enjoy and set realistic goals. Your health care provider or diabetes educator can help you make an activity plan that works for you. Exercise regularly as directed by your health care provider. This includes:  Performing resistance training twice a week such as push-ups, sit-ups, lifting weights, or using resistance bands.  Performing 150 minutes of cardio exercises each week such as walking, running, or playing sports.  Staying active and spending no more than 90 minutes at one time being inactive. Even short bursts of exercise are good for you. Three 10-minute sessions spread throughout the day are just as beneficial as a single 30-minute session. Some exercise ideas  include:  Taking the dog for a walk.  Taking the stairs instead of the elevator.  Dancing to your favorite song.  Doing an exercise video.  Doing your favorite exercise with a friend. RECOMMENDATIONS FOR EXERCISING WITH TYPE 1 OR TYPE 2 DIABETES   Check your blood glucose before exercising. If blood glucose levels are greater than 240 mg/dL, check for urine ketones. Do not exercise if ketones are present.  Avoid injecting insulin into areas of the body that are going to be exercised. For example, avoid injecting insulin into:  The arms when playing tennis.  The legs when jogging.  Keep a record of:  Food intake before and after you exercise.  Expected peak times of insulin action.  Blood glucose levels before and after you exercise.  The type and amount of exercise you have done.  Review your records with your health care provider. Your health care provider will help you to develop guidelines for adjusting food intake and insulin amounts before and after exercising.  If you take insulin or oral hypoglycemic agents, watch for signs and symptoms of hypoglycemia. They include:  Dizziness.    Shaking.  Sweating.  Chills.  Confusion.  Drink plenty of water while you exercise to prevent dehydration or heat stroke. Body water is lost during exercise and must be replaced.  Talk to your health care provider before starting an exercise program to make sure it is safe for you. Remember, almost any type of activity is better than none. Document Released: 09/04/2003 Document Revised: 10/29/2013 Document Reviewed: 11/21/2012 ExitCare Patient Information 2015 [employer]. This information is not intended to replace advice given to you by your health care provider. Make sure you discuss any questions you have with your health care provider.  

## 2014-03-27 ENCOUNTER — Ambulatory Visit: Payer: Self-pay | Attending: Internal Medicine

## 2014-03-27 DIAGNOSIS — E785 Hyperlipidemia, unspecified: Secondary | ICD-10-CM

## 2014-03-27 DIAGNOSIS — IMO0001 Reserved for inherently not codable concepts without codable children: Secondary | ICD-10-CM | POA: Insufficient documentation

## 2014-03-27 DIAGNOSIS — B351 Tinea unguium: Secondary | ICD-10-CM | POA: Insufficient documentation

## 2014-03-27 DIAGNOSIS — E119 Type 2 diabetes mellitus without complications: Secondary | ICD-10-CM

## 2014-03-27 DIAGNOSIS — E1165 Type 2 diabetes mellitus with hyperglycemia: Principal | ICD-10-CM

## 2014-03-27 LAB — COMPLETE METABOLIC PANEL WITH GFR
ALBUMIN: 4.1 g/dL (ref 3.5–5.2)
ALT: 19 U/L (ref 0–53)
AST: 17 U/L (ref 0–37)
Alkaline Phosphatase: 60 U/L (ref 39–117)
BUN: 14 mg/dL (ref 6–23)
CALCIUM: 9.8 mg/dL (ref 8.4–10.5)
CHLORIDE: 101 meq/L (ref 96–112)
CO2: 20 meq/L (ref 19–32)
CREATININE: 1.44 mg/dL — AB (ref 0.50–1.35)
GFR, EST AFRICAN AMERICAN: 65 mL/min
GFR, Est Non African American: 57 mL/min — ABNORMAL LOW
Glucose, Bld: 198 mg/dL — ABNORMAL HIGH (ref 70–99)
POTASSIUM: 4.1 meq/L (ref 3.5–5.3)
Sodium: 140 mEq/L (ref 135–145)
Total Bilirubin: 0.9 mg/dL (ref 0.2–1.2)
Total Protein: 7.1 g/dL (ref 6.0–8.3)

## 2014-03-27 LAB — HEMOGLOBIN A1C
HEMOGLOBIN A1C: 13.2 % — AB (ref <5.7)
Mean Plasma Glucose: 332 mg/dL — ABNORMAL HIGH (ref <117)

## 2014-03-27 LAB — LIPID PANEL
CHOL/HDL RATIO: 4.9 ratio
CHOLESTEROL: 238 mg/dL — AB (ref 0–200)
HDL: 49 mg/dL (ref >39)
LDL Cholesterol: 165 mg/dL — ABNORMAL HIGH (ref 0–99)
Triglycerides: 119 mg/dL (ref <150)
VLDL: 24 mg/dL (ref 0–40)

## 2014-03-28 LAB — MICROALBUMIN / CREATININE URINE RATIO
CREATININE, URINE: 128.6 mg/dL
MICROALB/CREAT RATIO: 43.5 mg/g — AB (ref 0.0–30.0)
Microalb, Ur: 5.6 mg/dL — ABNORMAL HIGH (ref <2.0)

## 2014-04-08 ENCOUNTER — Ambulatory Visit: Payer: Self-pay | Attending: Internal Medicine

## 2014-04-10 ENCOUNTER — Telehealth: Payer: Self-pay | Admitting: Internal Medicine

## 2014-04-10 ENCOUNTER — Emergency Department (HOSPITAL_COMMUNITY)
Admission: EM | Admit: 2014-04-10 | Discharge: 2014-04-10 | Disposition: A | Discharge status: Home / Self Care | Payer: No Typology Code available for payment source | Attending: Emergency Medicine | Admitting: Emergency Medicine

## 2014-04-10 ENCOUNTER — Encounter (HOSPITAL_COMMUNITY): Payer: Self-pay | Admitting: Emergency Medicine

## 2014-04-10 DIAGNOSIS — Z79899 Other long term (current) drug therapy: Secondary | ICD-10-CM | POA: Insufficient documentation

## 2014-04-10 DIAGNOSIS — Z7982 Long term (current) use of aspirin: Secondary | ICD-10-CM | POA: Insufficient documentation

## 2014-04-10 DIAGNOSIS — L089 Local infection of the skin and subcutaneous tissue, unspecified: Secondary | ICD-10-CM | POA: Insufficient documentation

## 2014-04-10 DIAGNOSIS — E119 Type 2 diabetes mellitus without complications: Secondary | ICD-10-CM | POA: Insufficient documentation

## 2014-04-10 DIAGNOSIS — Z791 Long term (current) use of non-steroidal anti-inflammatories (NSAID): Secondary | ICD-10-CM | POA: Insufficient documentation

## 2014-04-10 DIAGNOSIS — I10 Essential (primary) hypertension: Secondary | ICD-10-CM | POA: Insufficient documentation

## 2014-04-10 MED ORDER — MUPIROCIN CALCIUM 2 % NA OINT: TOPICAL_OINTMENT | NASAL | Status: DC

## 2014-04-10 MED ORDER — CLINDAMYCIN HCL 150 MG PO CAPS: 150.0000 mg | ORAL_CAPSULE | Freq: Four times a day (QID) | ORAL | Status: DC

## 2014-04-10 NOTE — Telephone Encounter (Signed)
Pt. Came into facility to request results of culture that was performed at the ED. Please f/u with pt.

## 2014-04-10 NOTE — ED Notes (Signed)
Pt c/o of 5 bumps/sores on right thigh, left leg, buttocks, chest, and under left arm.  Pt states that some are open and when he pulled off scab there was a white sac sitting inside and he tried pulling it out but kept breaking.

## 2014-04-10 NOTE — ED Provider Notes (Signed)
CSN: [id number]     Arrival date & time 04/10/14  [id number] History   First MD Initiated Contact with Patient 04/10/14 [phone]     Chief Complaint  Patient presents with  . bumps      At HPI Pt c/o of 5 bumps/sores on right thigh, left leg, buttocks, chest, and under left arm. Pt states that some are open and when he pulled off scab there was a white sac sitting inside and he tried pulling it out but kept breaking.   Past Medical History  Diagnosis Date  . Diabetes mellitus   . Hypertension   . Whiplash     04/2012   Past Surgical History  Procedure Laterality Date  . Appendectomy     Family History  Problem Relation Age of Onset  . Diabetes Mother   . Diabetes Brother   . Hypertension Maternal Uncle    History  Substance Use Topics  . Smoking status: Never Smoker   . Smokeless tobacco: Never Used  . Alcohol Use: No    Review of Systems  All other systems reviewed and are negative  Allergies  Review of patient's allergies indicates no known allergies.  Home Medications   Prior to Admission medications   Medication Sig Start Date End Date Taking? Authorizing Provider  aspirin 325 MG tablet Take 1 tablet (325 mg total) by mouth daily. 03/26/14  Yes Quentin Angstlugbemiga E Jegede, MD  Dapagliflozin Propanediol (FARXIGA) 10 MG TABS Take 10 mg by mouth daily. 03/26/14  Yes Quentin Angstlugbemiga E Jegede, MD  lisinopril-hydrochlorothiazide (ZESTORETIC) 20-12.5 MG per tablet Take 1 tablet by mouth daily. 03/26/14  Yes Quentin Angstlugbemiga E Jegede, MD  metFORMIN (GLUCOPHAGE) 1000 MG tablet Take 500 mg by mouth 2 (two) times daily with a meal.   Yes Historical Provider, MD  naproxen (NAPROSYN) 500 MG tablet Take 1 tablet (500 mg total) by mouth 2 (two) times daily. 02/16/14  Yes Toy CookeyMegan Docherty, MD  terbinafine (LAMISIL AT) 1 % cream Apply 1 application topically 2 (two) times daily. 03/26/14  Yes Quentin Angstlugbemiga E Jegede, MD  terbinafine (LAMISIL) 250 MG tablet Take 1 tablet (250 mg total) by mouth daily. 03/26/14  Yes  Quentin Angstlugbemiga E Jegede, MD  clindamycin (CLEOCIN) 150 MG capsule Take 1 capsule (150 mg total) by mouth every 6 (six) hours. 04/10/14   Nelia Shiobert L Earle Burson, MD  mupirocin nasal ointment (BACTROBAN) 2 % Apply in each nostril daily 04/10/14   Nelia Shiobert L Brielle Moro, MD   BP 130/79  Pulse 95  Temp(Src) 98.4 F (36.9 C) (Oral)  Resp 17  SpO2 100% Physical Exam Physical Exam  Nursing note and vitals reviewed. Constitutional: He is oriented to person, place, and time. He appears well-developed and well-nourished. No distress.  HENT:  Head: Normocephalic and atraumatic.  Eyes: Pupils are equal, round, and reactive to light.  Neck: Normal range of motion.  Cardiovascular: Normal rate and intact distal pulses.   Pulmonary/Chest: No respiratory distress.  Abdominal: Normal appearance. He exhibits no distension.  Musculoskeletal: Normal range of motion.  Neurological: He is alert and oriented to person, place, and time. No cranial nerve deficit.  Skin: Patient has a open draining wound on the in her aspect of right thigh.  Psychiatric: He has a normal mood and affect. His behavior is normal.   ED Course  Procedures (including critical care time) Labs Review The wound was cultured.    MDM   Final diagnoses:  Skin infection       Nelia Shiobert L Lenin Kuhnle, MD  04/10/14 1010 

## 2014-04-12 ENCOUNTER — Telehealth (HOSPITAL_COMMUNITY): Payer: Self-pay

## 2014-04-12 ENCOUNTER — Telehealth: Payer: Self-pay | Admitting: Emergency Medicine

## 2014-04-12 DIAGNOSIS — N289 Disorder of kidney and ureter, unspecified: Secondary | ICD-10-CM

## 2014-04-12 LAB — WOUND CULTURE: Special Requests: NORMAL

## 2014-04-12 MED ORDER — SIMVASTATIN 20 MG PO TABS: 20.0000 mg | ORAL_TABLET | Freq: Every day | ORAL | Status: DC

## 2014-04-12 NOTE — Telephone Encounter (Signed)
Pt given lab results with instructions to start taking prescribed Simvastatin 20 mg tab daily with diet/exercise and return in 2 weeks to recheck kidney function Scheduled pt in 2 weeks Medication ordered and e-scribed to [facility] pharmacy

## 2014-04-12 NOTE — Telephone Encounter (Signed)
Message copied by Darlis LoanSMITH, JILL D on Fri Apr 12, 2014  1:15 PM ------      Message from: Jeanann LewandowskyJEGEDE, OLUGBEMIGA E      Created: Wed Apr 10, 2014  6:27 PM       Please inform patient that his cholesterol level is high, there is evidence of some kidney damage from diabetes. We will need to repeat this test in 2 weeks, if it remains the same we will need to stop metformin and use other medications for diabetes. Will encourage low carbohydrate diet, regular physical exercise. We need to start medication for cholesterol.            Please call in prescription simvastatin 20 mg tablet by mouth daily 90 tablets with 2 refills. ------

## 2014-04-12 NOTE — Telephone Encounter (Signed)
Pt was informed or lab results at [facility]

## 2014-04-15 ENCOUNTER — Telehealth (HOSPITAL_BASED_OUTPATIENT_CLINIC_OR_DEPARTMENT_OTHER): Payer: Self-pay

## 2014-04-15 NOTE — Telephone Encounter (Signed)
Post ED Visit - Positive Culture Follow-up  Culture report reviewed by antimicrobial stewardship pharmacist: []  Wes Dulaney, Pharm.D., BCPS [x]  Celedonio MiyamotoJeremy Frens, Pharm.D., BCPS []  Georgina PillionElizabeth Martin, [address] BCPS []  RipleyMinh Pham, [address]Ph[address]., BCPS, AAHIVP []  Estella HuskMichelle Turner, Pharm.D., BCPS, AAHIVP []  Carly Sabat, Pharm.D. []  Enzo BiNathan Batchelder, [address]  Positive Wound culture Treated with Clindamycin, organism sensitive to the same and no further patient follow-up is required at this time.  Arvid RightClark, Joury Allcorn Dorn 04/15/2014, 4:09 AM

## 2014-04-25 ENCOUNTER — Ambulatory Visit: Payer: Self-pay | Attending: Internal Medicine

## 2014-04-25 DIAGNOSIS — N289 Disorder of kidney and ureter, unspecified: Secondary | ICD-10-CM

## 2014-04-25 LAB — BASIC METABOLIC PANEL
BUN: 14 mg/dL (ref 6–23)
CO2: 23 meq/L (ref 19–32)
Calcium: 9.3 mg/dL (ref 8.4–10.5)
Chloride: 104 mEq/L (ref 96–112)
Creat: 1.2 mg/dL (ref 0.50–1.35)
GLUCOSE: 252 mg/dL — AB (ref 70–99)
POTASSIUM: 4.7 meq/L (ref 3.5–5.3)
SODIUM: 139 meq/L (ref 135–145)

## 2014-05-21 ENCOUNTER — Emergency Department (HOSPITAL_COMMUNITY)
Admission: EM | Admit: 2014-05-21 | Discharge: 2014-05-22 | Disposition: A | Discharge status: Home / Self Care | Payer: No Typology Code available for payment source | Attending: Emergency Medicine | Admitting: Emergency Medicine

## 2014-05-21 DIAGNOSIS — Z7982 Long term (current) use of aspirin: Secondary | ICD-10-CM | POA: Insufficient documentation

## 2014-05-21 DIAGNOSIS — I10 Essential (primary) hypertension: Secondary | ICD-10-CM | POA: Insufficient documentation

## 2014-05-21 DIAGNOSIS — Z87828 Personal history of other (healed) physical injury and trauma: Secondary | ICD-10-CM | POA: Insufficient documentation

## 2014-05-21 DIAGNOSIS — Z79899 Other long term (current) drug therapy: Secondary | ICD-10-CM | POA: Insufficient documentation

## 2014-05-21 DIAGNOSIS — Z791 Long term (current) use of non-steroidal anti-inflammatories (NSAID): Secondary | ICD-10-CM | POA: Insufficient documentation

## 2014-05-21 DIAGNOSIS — L02416 Cutaneous abscess of left lower limb: Secondary | ICD-10-CM | POA: Insufficient documentation

## 2014-05-21 DIAGNOSIS — E119 Type 2 diabetes mellitus without complications: Secondary | ICD-10-CM | POA: Insufficient documentation

## 2014-05-22 ENCOUNTER — Encounter (HOSPITAL_COMMUNITY): Payer: Self-pay | Admitting: Emergency Medicine

## 2014-05-22 MED ORDER — HYDROCODONE-ACETAMINOPHEN 5-325 MG PO TABS: 1.0000 | ORAL_TABLET | ORAL | Status: DC | PRN

## 2014-05-22 MED ORDER — LIDOCAINE-EPINEPHRINE (PF) 2 %-1:200000 IJ SOLN
10.0000 mL | Freq: Once | INTRAMUSCULAR | Status: AC
  Administered 2014-05-22: 10 mL via INTRADERMAL
  Filled 2014-05-22: qty 20

## 2014-05-22 MED ORDER — CEPHALEXIN 250 MG PO CAPS: 250.0000 mg | ORAL_CAPSULE | Freq: Four times a day (QID) | ORAL | Status: DC

## 2014-05-22 MED ORDER — SULFAMETHOXAZOLE-TRIMETHOPRIM 800-160 MG PO TABS: 1.0000 | ORAL_TABLET | Freq: Two times a day (BID) | ORAL | Status: DC

## 2014-05-22 NOTE — ED Provider Notes (Signed)
CSN: [id number]     Arrival date & time 05/21/14  2345 History   First MD Initiated Contact with Patient 05/22/14 0032     Chief Complaint  Patient presents with  . Abscess     (Consider location/radiation/quality/duration/timing/severity/associated sxs/prior Treatment) HPI Vincent Osborne is a 48 y.o. male with history of diabetes, hypertension, presents to emergency department complaining of an abscess to the posterior left thigh. Patient states he noticed a bump a week ago, states picked at it, since then worsening swelling and pain. States he squeezed it yesterday and some pus came out. States pain is worsening. No medications tried prior to coming in. No fever, chills, malaise.  Past Medical History  Diagnosis Date  . Diabetes mellitus   . Hypertension   . Whiplash     04/2012   Past Surgical History  Procedure Laterality Date  . Appendectomy     Family History  Problem Relation Age of Onset  . Diabetes Mother   . Diabetes Brother   . Hypertension Maternal Uncle    History  Substance Use Topics  . Smoking status: Never Smoker   . Smokeless tobacco: Never Used  . Alcohol Use: No    Review of Systems  Constitutional: Negative for fever and chills.  Respiratory: Negative for cough, chest tightness and shortness of breath.   Cardiovascular: Negative for chest pain, palpitations and leg swelling.  Gastrointestinal: Negative for nausea, vomiting, abdominal pain, diarrhea and abdominal distention.  Genitourinary: Negative for dysuria, urgency, frequency and hematuria.  Musculoskeletal: Negative for myalgias, arthralgias, neck pain and neck stiffness.  Skin: Positive for wound. Negative for rash.  Neurological: Negative for weakness and headaches.  All other systems reviewed and are negative.     Allergies  Review of patient's allergies indicates no known allergies.  Home Medications   Prior to Admission medications   Medication Sig Start Date End Date Taking?  Authorizing Provider  aspirin 325 MG tablet Take 1 tablet (325 mg total) by mouth daily. 03/26/14  Yes Quentin Angstlugbemiga E Jegede, MD  Dapagliflozin Propanediol (FARXIGA) 10 MG TABS Take 10 mg by mouth daily. 03/26/14  Yes Quentin Angstlugbemiga E Jegede, MD  lisinopril-hydrochlorothiazide (ZESTORETIC) 20-12.5 MG per tablet Take 1 tablet by mouth daily. 03/26/14  Yes Quentin Angstlugbemiga E Jegede, MD  metFORMIN (GLUCOPHAGE) 1000 MG tablet Take 500 mg by mouth 2 (two) times daily with a meal.   Yes Historical Provider, MD  mupirocin nasal ointment (BACTROBAN) 2 % Apply in each nostril daily 04/10/14  Yes Nelia Shiobert L Beaton, MD  naproxen (NAPROSYN) 500 MG tablet Take 1 tablet (500 mg total) by mouth 2 (two) times daily. 02/16/14  Yes Toy CookeyMegan Docherty, MD  simvastatin (ZOCOR) 20 MG tablet Take 1 tablet (20 mg total) by mouth at bedtime. Patient taking differently: Take 20 mg by mouth every morning.  04/12/14  Yes Quentin Angstlugbemiga E Jegede, MD  terbinafine (LAMISIL AT) 1 % cream Apply 1 application topically 2 (two) times daily. 03/26/14  Yes Quentin Angstlugbemiga E Jegede, MD  terbinafine (LAMISIL) 250 MG tablet Take 1 tablet (250 mg total) by mouth daily. 03/26/14  Yes Quentin Angstlugbemiga E Jegede, MD  clindamycin (CLEOCIN) 150 MG capsule Take 1 capsule (150 mg total) by mouth every 6 (six) hours. Patient not taking: Reported on 05/22/2014 04/10/14   Nelia Shiobert L Beaton, MD   BP 153/97 mmHg  Pulse 84  Temp(Src) 97.7 F (36.5 C) (Oral)  Resp 18  Ht 6\' 2"  (1.88 m)  Wt 199 lb (90.266 kg)  BMI 25.54 kg/m2  SpO2 99% Physical Exam  Constitutional: He appears well-developed and well-nourished. No distress.  HENT:  Head: Normocephalic and atraumatic.  Eyes: Conjunctivae are normal.  Neck: Neck supple.  Cardiovascular: Normal rate, regular rhythm and normal heart sounds.   Pulmonary/Chest: Effort normal. No respiratory distress. He has no wheezes. He has no rales.  Musculoskeletal: He exhibits no edema.  Neurological: He is alert.  Skin: Skin is warm and dry.   4x4cm area of induration to the left posterior distal thigh. No drainage. Area is erythematous with white central cap. Fluctuant.   Nursing note and vitals reviewed.   ED Course  Procedures (including critical care time) Labs Review Labs Reviewed - No data to display  Imaging Review No results found.  INCISION AND DRAINAGE Performed by: Jaynie CrumbleKIRICHENKO, Audreena Sachdeva A Consent: Verbal consent obtained. Risks and benefits: risks, benefits and alternatives were discussed Type: abscess  Body area: left posterior thigh  Anesthesia: local infiltration  Incision was made with a scalpel.  Local anesthetic: lidocaine 2% w epinephrine  Anesthetic total: 3 ml  Complexity: complex Blunt dissection to break up loculations  Drainage: purulent  Drainage amount: large  Packing material: 1/4 in iodoform gauze  Patient tolerance: Patient tolerated the procedure well with no immediate complications.     MDM   Final diagnoses:  Abscess of left leg    Pt with left posterior thigh abscess. He is afebrile, non toxic appearing. Abscess incised and drained. Home with antibiotics, recheck in 2 days given diabetic. Return precautions discussed.   Filed Vitals:   05/21/14 2351 05/22/14 0224  BP: 153/97 144/86  Pulse: 84 80  Temp: 97.7 F (36.5 C)   TempSrc: Oral   Resp: 18 18  Height: 6\' 2"  (1.88 m)   Weight: 199 lb (90.266 kg)   SpO2: 99% 100%      Lottie Musselatyana A Magie Ciampa, PA-C 05/22/14 0345  Olivia Mackielga M Otter, MD 05/22/14 [phone]

## 2014-05-22 NOTE — ED Notes (Signed)
Pt arrived to the Ed with a complaint of an abscess under the left knee.  Pt states the abscess has been there for a week.  Pt states abscess has been draining for the same amount of time.

## 2014-05-22 NOTE — Discharge Instructions (Signed)
Take both antibiotics as prescribed until all gone. Warm compresses over the wound. Keep clean. Return or follow up with your doctor in 2 days for wound recheck and packing removal. Norco for pain as needed. Return sooner if symptoms are worsening.   Abscess An abscess is an infected area that contains a collection of pus and debris.It can occur in almost any part of the body. An abscess is also known as a furuncle or boil. CAUSES  An abscess occurs when tissue gets infected. This can occur from blockage of oil or sweat glands, infection of hair follicles, or a minor injury to the skin. As the body tries to fight the infection, pus collects in the area and creates pressure under the skin. This pressure causes pain. People with weakened immune systems have difficulty fighting infections and get certain abscesses more often.  SYMPTOMS Usually an abscess develops on the skin and becomes a painful mass that is red, warm, and tender. If the abscess forms under the skin, you may feel a moveable soft area under the skin. Some abscesses break open (rupture) on their own, but most will continue to get worse without care. The infection can spread deeper into the body and eventually into the bloodstream, causing you to feel ill.  DIAGNOSIS  Your caregiver will take your medical history and perform a physical exam. A sample of fluid may also be taken from the abscess to determine what is causing your infection. TREATMENT  Your caregiver may prescribe antibiotic medicines to fight the infection. However, taking antibiotics alone usually does not cure an abscess. Your caregiver may need to make a small cut (incision) in the abscess to drain the pus. In some cases, gauze is packed into the abscess to reduce pain and to continue draining the area. HOME CARE INSTRUCTIONS   Only take over-the-counter or prescription medicines for pain, discomfort, or fever as directed by your caregiver.  If you were prescribed  antibiotics, take them as directed. Finish them even if you start to feel better.  If gauze is used, follow your caregiver's directions for changing the gauze.  To avoid spreading the infection:  Keep your draining abscess covered with a bandage.  Wash your hands well.  Do not share personal care items, towels, or whirlpools with others.  Avoid skin contact with others.  Keep your skin and clothes clean around the abscess.  Keep all follow-up appointments as directed by your caregiver. SEEK MEDICAL CARE IF:   You have increased pain, swelling, redness, fluid drainage, or bleeding.  You have muscle aches, chills, or a general ill feeling.  You have a fever. MAKE SURE YOU:   Understand these instructions.  Will watch your condition.  Will get help right away if you are not doing well or get worse. Document Released: 03/24/2005 Document Revised: 12/14/2011 Document Reviewed: 08/27/2011 [facility] Patient Information 2015 [place]Care, [place]. This information is not intended to replace advice given to you by your health care provider. Make sure you discuss any questions you have with your health care provider.

## 2014-05-22 NOTE — ED Notes (Signed)
Small amount of serosanguinous drainage noted.

## 2014-05-24 ENCOUNTER — Encounter (HOSPITAL_COMMUNITY): Payer: Self-pay | Admitting: Emergency Medicine

## 2014-05-24 ENCOUNTER — Emergency Department (HOSPITAL_COMMUNITY)
Admission: EM | Admit: 2014-05-24 | Discharge: 2014-05-24 | Disposition: A | Discharge status: Home / Self Care | Payer: No Typology Code available for payment source | Attending: Emergency Medicine | Admitting: Emergency Medicine

## 2014-05-24 DIAGNOSIS — Z791 Long term (current) use of non-steroidal anti-inflammatories (NSAID): Secondary | ICD-10-CM | POA: Insufficient documentation

## 2014-05-24 DIAGNOSIS — I10 Essential (primary) hypertension: Secondary | ICD-10-CM | POA: Insufficient documentation

## 2014-05-24 DIAGNOSIS — E119 Type 2 diabetes mellitus without complications: Secondary | ICD-10-CM | POA: Insufficient documentation

## 2014-05-24 DIAGNOSIS — Z4801 Encounter for change or removal of surgical wound dressing: Secondary | ICD-10-CM | POA: Insufficient documentation

## 2014-05-24 DIAGNOSIS — Z09 Encounter for follow-up examination after completed treatment for conditions other than malignant neoplasm: Secondary | ICD-10-CM

## 2014-05-24 DIAGNOSIS — Z792 Long term (current) use of antibiotics: Secondary | ICD-10-CM | POA: Insufficient documentation

## 2014-05-24 DIAGNOSIS — Z79899 Other long term (current) drug therapy: Secondary | ICD-10-CM | POA: Insufficient documentation

## 2014-05-24 DIAGNOSIS — Z7982 Long term (current) use of aspirin: Secondary | ICD-10-CM | POA: Insufficient documentation

## 2014-05-24 MED ORDER — HYDROCODONE-ACETAMINOPHEN 5-325 MG PO TABS: 2.0000 | ORAL_TABLET | ORAL | Status: DC | PRN

## 2014-05-24 NOTE — Discharge Instructions (Signed)
So your abscess twice a day, continue to change her dressing twice a day, medications as prescribed by her doctor, return to hospital if symptoms worsen  Please call your doctor for a followup appointment within 24-48 hours. When you talk to your doctor please let them know that you were seen in the emergency department and have them acquire all of your records so that they can discuss the findings with you and formulate a treatment plan to fully care for your new and ongoing problems.

## 2014-05-24 NOTE — ED Provider Notes (Signed)
CSN: [id number]     Arrival date & time 05/24/14  2245 History   First MD Initiated Contact with Patient 05/24/14 2332     Chief Complaint  Patient presents with  . Wound Check     (Consider location/radiation/quality/duration/timing/severity/associated sxs/prior Treatment) HPI Comments: The patient is a 48 year old male who presents to the hospital 2 days after having incision and drainage of his left distal posterior thigh abscess. It has been having a small amount of ongoing drainage, no fever, pain is well controlled with medications though he is almost out, taking medications as prescribed at the emergency department including antibiotics and pain medications.  Patient is a 48 y.o. male presenting with wound check. The history is provided by the patient.  Wound Check    Past Medical History  Diagnosis Date  . Diabetes mellitus   . Hypertension   . Whiplash     04/2012   Past Surgical History  Procedure Laterality Date  . Appendectomy     Family History  Problem Relation Age of Onset  . Diabetes Mother   . Diabetes Brother   . Hypertension Maternal Uncle    History  Substance Use Topics  . Smoking status: Never Smoker   . Smokeless tobacco: Never Used  . Alcohol Use: No    Review of Systems  Constitutional: Negative for fever and chills.  Gastrointestinal: Negative for nausea and vomiting.  Skin: Positive for rash.       abscess      Allergies  Review of patient's allergies indicates no known allergies.  Home Medications   Prior to Admission medications   Medication Sig Start Date End Date Taking? Authorizing Provider  aspirin 325 MG tablet Take 1 tablet (325 mg total) by mouth daily. 03/26/14   Quentin Angstlugbemiga E Jegede, MD  cephALEXin (KEFLEX) 250 MG capsule Take 1 capsule (250 mg total) by mouth 4 (four) times daily. 05/22/14   Tatyana A Kirichenko, PA-C  clindamycin (CLEOCIN) 150 MG capsule Take 1 capsule (150 mg total) by mouth every 6 (six)  hours. Patient not taking: Reported on 05/22/2014 04/10/14   Nelia Shiobert L Beaton, MD  Dapagliflozin Propanediol (FARXIGA) 10 MG TABS Take 10 mg by mouth daily. 03/26/14   Quentin Angstlugbemiga E Jegede, MD  HYDROcodone-acetaminophen (NORCO/VICODIN) 5-325 MG per tablet Take 2 tablets by mouth every 4 (four) hours as needed. 05/24/14   Vida RollerBrian D Azarion Hove, MD  lisinopril-hydrochlorothiazide (ZESTORETIC) 20-12.5 MG per tablet Take 1 tablet by mouth daily. 03/26/14   Quentin Angstlugbemiga E Jegede, MD  metFORMIN (GLUCOPHAGE) 1000 MG tablet Take 500 mg by mouth 2 (two) times daily with a meal.    Historical Provider, MD  mupirocin nasal ointment (BACTROBAN) 2 % Apply in each nostril daily 04/10/14   Nelia Shiobert L Beaton, MD  naproxen (NAPROSYN) 500 MG tablet Take 1 tablet (500 mg total) by mouth 2 (two) times daily. 02/16/14   Toy CookeyMegan Docherty, MD  simvastatin (ZOCOR) 20 MG tablet Take 1 tablet (20 mg total) by mouth at bedtime. Patient taking differently: Take 20 mg by mouth every morning.  04/12/14   Quentin Angstlugbemiga E Jegede, MD  sulfamethoxazole-trimethoprim (SEPTRA DS) 800-160 MG per tablet Take 1 tablet by mouth every 12 (twelve) hours. 05/22/14   Tatyana A Kirichenko, PA-C  terbinafine (LAMISIL AT) 1 % cream Apply 1 application topically 2 (two) times daily. 03/26/14   Quentin Angstlugbemiga E Jegede, MD  terbinafine (LAMISIL) 250 MG tablet Take 1 tablet (250 mg total) by mouth daily. 03/26/14   Quentin Angstlugbemiga E Jegede, MD  BP 147/78 mmHg  Pulse 86  Temp(Src) 97.8 F (36.6 C) (Oral)  Resp 15  Ht 6\' 2"  (1.88 m)  Wt 199 lb (90.266 kg)  BMI 25.54 kg/m2  SpO2 99% Physical Exam  Constitutional: He appears well-developed and well-nourished. No distress.  HENT:  Head: Normocephalic and atraumatic.  Eyes: Conjunctivae are normal. Right eye exhibits no discharge. Left eye exhibits no discharge. No scleral icterus.  Cardiovascular: Normal rate and regular rhythm.   No murmur heard. Pulmonary/Chest: Effort normal and breath sounds normal.  Musculoskeletal:  He exhibits tenderness. He exhibits no edema.  Small amount of tenderness and surrounding induration approximately 1 cm around incision site, no foul-smelling drainage, packing removed, clean dressing applied.  Skin: Skin is warm and dry. He is not diaphoretic.  Nursing note and vitals reviewed.   ED Course  Procedures (including critical care time) Labs Review Labs Reviewed - No data to display  Imaging Review No results found.   MDM   Final diagnoses:  Encounter for recheck of abscess following incision and drainage    There is no surrounding cellulitis around the abscess, no fever, well-healed wound, he will continue soaks, finish antibiotics and apply good wound care. Small amount of Vicodin given for ongoing pain.  Meds given in ED:  Medications - No data to display  New Prescriptions   HYDROCODONE-ACETAMINOPHEN (NORCO/VICODIN) 5-325 MG PER TABLET    Take 2 tablets by mouth every 4 (four) hours as needed.        Vida RollerBrian D Thatcher Doberstein, MD 05/24/14 [phone]

## 2014-05-24 NOTE — ED Notes (Signed)
Pt presents for wound check to L inner knee, wound I & D 11/25 with packing in place.

## 2014-05-27 ENCOUNTER — Ambulatory Visit: Payer: Self-pay | Attending: Internal Medicine | Admitting: Internal Medicine

## 2014-05-27 ENCOUNTER — Encounter: Payer: Self-pay | Admitting: Internal Medicine

## 2014-05-27 VITALS — BP 143/87 | HR 88 | Temp 98.3°F | Resp 18 | Ht 74.0 in | Wt 210.0 lb

## 2014-05-27 DIAGNOSIS — R739 Hyperglycemia, unspecified: Secondary | ICD-10-CM

## 2014-05-27 DIAGNOSIS — E1165 Type 2 diabetes mellitus with hyperglycemia: Secondary | ICD-10-CM | POA: Insufficient documentation

## 2014-05-27 DIAGNOSIS — I10 Essential (primary) hypertension: Secondary | ICD-10-CM | POA: Insufficient documentation

## 2014-05-27 DIAGNOSIS — Z791 Long term (current) use of non-steroidal anti-inflammatories (NSAID): Secondary | ICD-10-CM | POA: Insufficient documentation

## 2014-05-27 DIAGNOSIS — L02435 Carbuncle of right lower limb: Secondary | ICD-10-CM | POA: Insufficient documentation

## 2014-05-27 DIAGNOSIS — E785 Hyperlipidemia, unspecified: Secondary | ICD-10-CM | POA: Insufficient documentation

## 2014-05-27 DIAGNOSIS — Z7982 Long term (current) use of aspirin: Secondary | ICD-10-CM | POA: Insufficient documentation

## 2014-05-27 LAB — GLUCOSE, POCT (MANUAL RESULT ENTRY): POC Glucose: 314 mg/dl — AB (ref 70–99)

## 2014-05-27 NOTE — Progress Notes (Signed)
HFU Abces on lt leg Stated is itching and sharp pain on and off Swelling no warm to the touch, stated about 2 weeks ago  Pt did not take his BS medication this morning.

## 2014-05-27 NOTE — Progress Notes (Signed)
Patient ID: Vincent Osborne, male   DOB: May 31, 1966, 48 y.o.   MRN: [id number]   Vincent Osborne, is a 48 y.o. male  [id number]  [id number]  DOB - May 31, 1966  Chief Complaint  Patient presents with  . Hospitalization Follow-up        Subjective:   Vincent Osborne is a 48 y.o. male here today for a follow up visit. Patient has history of hypertension, diabetes mellitus and hyperlipidemia recently seen in the ED for abscess in the posterior thigh, status post incision and drainage in the ED. Patient was given antibiotics. Patient is here today for ED follow-up. He has no complaint today. Wound site is healing. No fever. There is still a little swelling but is much improved. Patient has No headache, No chest pain, No abdominal pain - No Nausea, No new weakness tingling or numbness, No Cough - SOB.  Problem  Hyperglycemia    ALLERGIES: No Known Allergies  PAST MEDICAL HISTORY: Past Medical History  Diagnosis Date  . Diabetes mellitus   . Hypertension   . Whiplash     04/2012  . Hyperlipidemia     MEDICATIONS AT HOME: Prior to Admission medications   Medication Sig Start Date End Date Taking? Authorizing Provider  aspirin 325 MG tablet Take 1 tablet (325 mg total) by mouth daily. 03/26/14  Yes Quentin Angstlugbemiga E Casimiro Lienhard, MD  Dapagliflozin Propanediol (FARXIGA) 10 MG TABS Take 10 mg by mouth daily. 03/26/14  Yes Quentin Angstlugbemiga E Brexley Cutshaw, MD  HYDROcodone-acetaminophen (NORCO/VICODIN) 5-325 MG per tablet Take 2 tablets by mouth every 4 (four) hours as needed. 05/24/14  Yes Vida RollerBrian D Miller, MD  lisinopril-hydrochlorothiazide (ZESTORETIC) 20-12.5 MG per tablet Take 1 tablet by mouth daily. 03/26/14  Yes Quentin Angstlugbemiga E Dillin Lofgren, MD  metFORMIN (GLUCOPHAGE) 1000 MG tablet Take 500 mg by mouth 2 (two) times daily with a meal.   Yes Historical Provider, MD  naproxen (NAPROSYN) 500 MG tablet Take 1 tablet (500 mg total) by mouth 2 (two) times daily. 02/16/14  Yes Toy CookeyMegan Docherty, MD  simvastatin (ZOCOR) 20 MG  tablet Take 1 tablet (20 mg total) by mouth at bedtime. Patient taking differently: Take 20 mg by mouth every morning.  04/12/14  Yes Quentin Angstlugbemiga E Donnis Pecha, MD  mupirocin nasal ointment (BACTROBAN) 2 % Apply in each nostril daily Patient not taking: Reported on 05/27/2014 04/10/14   Nelia Shiobert L Beaton, MD  sulfamethoxazole-trimethoprim (SEPTRA DS) 800-160 MG per tablet Take 1 tablet by mouth every 12 (twelve) hours. Patient not taking: Reported on 05/27/2014 05/22/14   Tatyana A Kirichenko, PA-C  terbinafine (LAMISIL AT) 1 % cream Apply 1 application topically 2 (two) times daily. Patient not taking: Reported on 05/27/2014 03/26/14   Quentin Angstlugbemiga E Elisavet Buehrer, MD  terbinafine (LAMISIL) 250 MG tablet Take 1 tablet (250 mg total) by mouth daily. Patient not taking: Reported on 05/27/2014 03/26/14   Quentin Angstlugbemiga E Hanaa Payes, MD     Objective:   Filed Vitals:   05/27/14 0929  BP: 143/87  Pulse: 88  Temp: 98.3 F (36.8 C)  TempSrc: Oral  Resp: 18  Height: 6\' 2"  (1.88 m)  Weight: 210 lb (95.255 kg)  SpO2: 99%    Exam General appearance : Awake, alert, not in any distress. Speech Clear. Not toxic looking HEENT: Atraumatic and Normocephalic, pupils equally reactive to light and accomodation Neck: supple, no JVD. No cervical lymphadenopathy.  Chest:Good air entry bilaterally, no added sounds  CVS: S1 S2 regular, no murmurs.  Abdomen: Bowel sounds present, Non tender and not distended  with no gaurding, rigidity or rebound. Extremities: B/L Lower Ext shows no edema, both legs are warm to touch Mild tenderness and surrounding induration approximately 1 cm around incision site, no foul-smelling drainage, clean and dry dressing in situ.  Neurology: Awake alert, and oriented X 3, CN II-XII intact, Non focal Skin:No Rash Wounds:N/A  Data Review Lab Results  Component Value Date   HGBA1C 13.2* 03/27/2014   HGBA1C 13.4 12/20/2013   HGBA1C =>14 09/19/2013     Assessment & Plan   1. Hyperglycemia  -  Glucose (CBG)   Aim for 2-3 Carb Choices per meal (30-45 grams) +/- 1 either way  Aim for 0-15 Carbs per snack if hungry  Include protein in moderation with your meals and snacks  Consider reading food labels for Total Carbohydrate and Fat Grams of foods  Consider checking BG at alternate times per day  Continue taking medication as directed Fruit Punch - find one with no sugar  Measure and decrease portions of carbohydrate foods  Make your plate and don't go back for seconds   2. Carbuncle of leg, right Continue current antibiotics. Reported to clinic immediately if fever develops or if pain getting worse  Return in about 4 weeks (around 06/24/2014) for Carbuncle, Hemoglobin A1C and Follow up, DM.  The patient was given clear instructions to go to ER or return to medical center if symptoms don't improve, worsen or new problems develop. The patient verbalized understanding. The patient was told to call to get lab results if they haven't heard anything in the next week.   This note has been created with Education officer, environmentalDragon speech recognition software and smart phrase technology. Any transcriptional errors are unintentional.    Jeanann LewandowskyJEGEDE, Jeannett Dekoning, MD, MHA, FACP, FAAP [facility] [place], [place] [phone]   05/27/2014, 10:10 AM

## 2014-05-27 NOTE — Patient Instructions (Signed)
Basic Carbohydrate Counting for Diabetes Mellitus Carbohydrate counting is a method for keeping track of the amount of carbohydrates you eat. Eating carbohydrates naturally increases the level of sugar (glucose) in your blood, so it is important for you to know the amount that is okay for you to have in every meal. Carbohydrate counting helps keep the level of glucose in your blood within normal limits. The amount of carbohydrates allowed is different for every person. A dietitian can help you calculate the amount that is right for you. Once you know the amount of carbohydrates you can have, you can count the carbohydrates in the foods you want to eat. Carbohydrates are found in the following foods:  Grains, such as breads and cereals.  Dried beans and soy products.  Starchy vegetables, such as potatoes, peas, and corn.  Fruit and fruit juices.  Milk and yogurt.  Sweets and snack foods, such as cake, cookies, candy, chips, soft drinks, and fruit drinks. CARBOHYDRATE COUNTING There are two ways to count the carbohydrates in your food. You can use either of the methods or a combination of both. Reading the "Nutrition Facts" on Packaged Food The "Nutrition Facts" is an area that is included on the labels of almost all packaged food and beverages in the [place]. It includes the serving size of that food or beverage and information about the nutrients in each serving of the food, including the grams (g) of carbohydrate per serving.  Decide the number of servings of this food or beverage that you will be able to eat or drink. Multiply that number of servings by the number of grams of carbohydrate that is listed on the label for that serving. The total will be the amount of carbohydrates you will be having when you eat or drink this food or beverage. Learning Standard Serving Sizes of Food When you eat food that is not packaged or does not include "Nutrition Facts" on the label, you need to  measure the servings in order to count the amount of carbohydrates.A serving of most carbohydrate-rich foods contains about 15 g of carbohydrates. The following list includes serving sizes of carbohydrate-rich foods that provide 15 g ofcarbohydrate per serving:   1 slice of bread (1 oz) or 1 six-inch tortilla.    of a hamburger bun or English muffin.  4-6 crackers.   cup unsweetened dry cereal.    cup hot cereal.   cup rice or pasta.    cup mashed potatoes or  of a large baked potato.  1 cup fresh fruit or one small piece of fruit.    cup canned or frozen fruit or fruit juice.  1 cup milk.   cup plain fat-free yogurt or yogurt sweetened with artificial sweeteners.   cup cooked dried beans or starchy vegetable, such as peas, corn, or potatoes.  Decide the number of standard-size servings that you will eat. Multiply that number of servings by 15 (the grams of carbohydrates in that serving). For example, if you eat 2 cups of strawberries, you will have eaten 2 servings and 30 g of carbohydrates (2 servings x 15 g = 30 g). For foods such as soups and casseroles, in which more than one food is mixed in, you will need to count the carbohydrates in each food that is included. EXAMPLE OF CARBOHYDRATE COUNTING Sample Dinner  3 oz chicken breast.   cup of brown rice.   cup of corn.  1 cup milk.   1 cup strawberries with   sugar-free whipped topping.  Carbohydrate Calculation Step 1: Identify the foods that contain carbohydrates:   Rice.   Corn.   Milk.   Strawberries. Step 2:Calculate the number of servings eaten of each:   2 servings of rice.   1 serving of corn.   1 serving of milk.   1 serving of strawberries. Step 3: Multiply each of those number of servings by 15 g:   2 servings of rice x 15 g = 30 g.   1 serving of corn x 15 g = 15 g.   1 serving of milk x 15 g = 15 g.   1 serving of strawberries x 15 g = 15 g. Step 4: Add  together all of the amounts to find the total grams of carbohydrates eaten: 30 g + 15 g + 15 g + 15 g = 75 g. Document Released: 06/14/2005 Document Revised: 10/29/2013 Document Reviewed: 05/11/2013 [facility] Patient Information 2015 [place]Care, [place]. This information is not intended to replace advice given to you by your health care provider. Make sure you discuss any questions you have with your health care provider. Abscess An abscess is an infected area that contains a collection of pus and debris.It can occur in almost any part of the body. An abscess is also known as a furuncle or boil. CAUSES  An abscess occurs when tissue gets infected. This can occur from blockage of oil or sweat glands, infection of hair follicles, or a minor injury to the skin. As the body tries to fight the infection, pus collects in the area and creates pressure under the skin. This pressure causes pain. People with weakened immune systems have difficulty fighting infections and get certain abscesses more often.  SYMPTOMS Usually an abscess develops on the skin and becomes a painful mass that is red, warm, and tender. If the abscess forms under the skin, you may feel a moveable soft area under the skin. Some abscesses break open (rupture) on their own, but most will continue to get worse without care. The infection can spread deeper into the body and eventually into the bloodstream, causing you to feel ill.  DIAGNOSIS  Your caregiver will take your medical history and perform a physical exam. A sample of fluid may also be taken from the abscess to determine what is causing your infection. TREATMENT  Your caregiver may prescribe antibiotic medicines to fight the infection. However, taking antibiotics alone usually does not cure an abscess. Your caregiver may need to make a small cut (incision) in the abscess to drain the pus. In some cases, gauze is packed into the abscess to reduce pain and to continue draining the  area. HOME CARE INSTRUCTIONS   Only take over-the-counter or prescription medicines for pain, discomfort, or fever as directed by your caregiver.  If you were prescribed antibiotics, take them as directed. Finish them even if you start to feel better.  If gauze is used, follow your caregiver's directions for changing the gauze.  To avoid spreading the infection:  Keep your draining abscess covered with a bandage.  Wash your hands well.  Do not share personal care items, towels, or whirlpools with others.  Avoid skin contact with others.  Keep your skin and clothes clean around the abscess.  Keep all follow-up appointments as directed by your caregiver. SEEK MEDICAL CARE IF:   You have increased pain, swelling, redness, fluid drainage, or bleeding.  You have muscle aches, chills, or a general ill feeling.  You have  a fever. MAKE SURE YOU:   Understand these instructions.  Will watch your condition.  Will get help right away if you are not doing well or get worse. Document Released: 03/24/2005 Document Revised: 12/14/2011 Document Reviewed: 08/27/2011 [facility] Patient Information 2015 [place]ExitCare, [place]LL[place]. This information is not intended to replace advice given to you by your health care provider. Make sure you discuss any questions you have with your health care provider.

## 2014-05-29 ENCOUNTER — Other Ambulatory Visit: Payer: Self-pay

## 2014-05-29 MED ORDER — CLINDAMYCIN HCL 300 MG PO CAPS: 300.0000 mg | ORAL_CAPSULE | Freq: Three times a day (TID) | ORAL | Status: DC

## 2014-06-24 ENCOUNTER — Ambulatory Visit: Payer: Self-pay | Attending: Internal Medicine

## 2014-06-24 DIAGNOSIS — E119 Type 2 diabetes mellitus without complications: Secondary | ICD-10-CM

## 2014-06-24 LAB — HEMOGLOBIN A1C
Hgb A1c MFr Bld: 12 % — ABNORMAL HIGH (ref <5.7)
MEAN PLASMA GLUCOSE: 298 mg/dL — AB (ref <117)

## 2014-08-08 ENCOUNTER — Emergency Department (HOSPITAL_COMMUNITY)
Admission: EM | Admit: 2014-08-08 | Discharge: 2014-08-08 | Disposition: A | Discharge status: Home / Self Care | Payer: No Typology Code available for payment source | Attending: Emergency Medicine | Admitting: Emergency Medicine

## 2014-08-08 ENCOUNTER — Emergency Department (HOSPITAL_COMMUNITY): Payer: No Typology Code available for payment source

## 2014-08-08 ENCOUNTER — Encounter (HOSPITAL_COMMUNITY): Payer: Self-pay | Admitting: Nurse Practitioner

## 2014-08-08 DIAGNOSIS — S20212A Contusion of left front wall of thorax, initial encounter: Secondary | ICD-10-CM | POA: Diagnosis not present

## 2014-08-08 DIAGNOSIS — E119 Type 2 diabetes mellitus without complications: Secondary | ICD-10-CM | POA: Insufficient documentation

## 2014-08-08 DIAGNOSIS — Z7982 Long term (current) use of aspirin: Secondary | ICD-10-CM | POA: Insufficient documentation

## 2014-08-08 DIAGNOSIS — I10 Essential (primary) hypertension: Secondary | ICD-10-CM | POA: Diagnosis not present

## 2014-08-08 DIAGNOSIS — Y998 Other external cause status: Secondary | ICD-10-CM | POA: Insufficient documentation

## 2014-08-08 DIAGNOSIS — Z79899 Other long term (current) drug therapy: Secondary | ICD-10-CM | POA: Diagnosis not present

## 2014-08-08 DIAGNOSIS — Z792 Long term (current) use of antibiotics: Secondary | ICD-10-CM | POA: Insufficient documentation

## 2014-08-08 DIAGNOSIS — Y9389 Activity, other specified: Secondary | ICD-10-CM | POA: Diagnosis not present

## 2014-08-08 DIAGNOSIS — Y92410 Unspecified street and highway as the place of occurrence of the external cause: Secondary | ICD-10-CM | POA: Insufficient documentation

## 2014-08-08 DIAGNOSIS — S299XXA Unspecified injury of thorax, initial encounter: Secondary | ICD-10-CM | POA: Diagnosis present

## 2014-08-08 DIAGNOSIS — E785 Hyperlipidemia, unspecified: Secondary | ICD-10-CM | POA: Insufficient documentation

## 2014-08-08 MED ORDER — OXYCODONE-ACETAMINOPHEN 5-325 MG PO TABS: ORAL_TABLET | ORAL | Status: DC

## 2014-08-08 MED ORDER — IBUPROFEN 800 MG PO TABS
800.0000 mg | ORAL_TABLET | Freq: Once | ORAL | Status: AC
  Administered 2014-08-08: 800 mg via ORAL
  Filled 2014-08-08: qty 1
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
## 2014-08-08 NOTE — Discharge Instructions (Signed)
For pain control please take ibuprofen (also known as Motrin or Advil) 800mg  (this is normally 4 over the counter pills) 3 times a day  for 5 days. Take with food to minimize stomach irritation.  Take percocet for breakthrough pain, do not drink alcohol, drive, care for children or do other critical tasks while taking percocet.   It is very important that you take deep breaths to prevent lung collapse and infection.  Take 10 deep breaths and or use your incentive spirometer every hour to prevent lung collapse.  If you develop cough, fever or shortness of breath return immediately to the emergency room.  Please follow with your primary care doctor in the next 2 days for a check-up. They must obtain records for further management.   Do not hesitate to return to the Emergency Department for any new, worsening or concerning symptoms.
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
## 2014-08-20 ENCOUNTER — Emergency Department (HOSPITAL_COMMUNITY): Payer: Self-pay

## 2014-08-20 ENCOUNTER — Encounter (HOSPITAL_COMMUNITY): Payer: Self-pay

## 2014-08-20 ENCOUNTER — Emergency Department (HOSPITAL_COMMUNITY)
Admission: EM | Admit: 2014-08-20 | Discharge: 2014-08-20 | Disposition: A | Discharge status: Home / Self Care | Payer: Self-pay | Attending: Emergency Medicine | Admitting: Emergency Medicine

## 2014-08-20 DIAGNOSIS — Z7982 Long term (current) use of aspirin: Secondary | ICD-10-CM | POA: Insufficient documentation

## 2014-08-20 DIAGNOSIS — Z79899 Other long term (current) drug therapy: Secondary | ICD-10-CM | POA: Insufficient documentation

## 2014-08-20 DIAGNOSIS — R52 Pain, unspecified: Secondary | ICD-10-CM

## 2014-08-20 DIAGNOSIS — E785 Hyperlipidemia, unspecified: Secondary | ICD-10-CM | POA: Insufficient documentation

## 2014-08-20 DIAGNOSIS — I10 Essential (primary) hypertension: Secondary | ICD-10-CM | POA: Insufficient documentation

## 2014-08-20 DIAGNOSIS — M5431 Sciatica, right side: Secondary | ICD-10-CM | POA: Insufficient documentation

## 2014-08-20 DIAGNOSIS — E119 Type 2 diabetes mellitus without complications: Secondary | ICD-10-CM | POA: Insufficient documentation

## 2014-08-20 MED ORDER — CELECOXIB 200 MG PO CAPS: 200.0000 mg | ORAL_CAPSULE | Freq: Two times a day (BID) | ORAL | Status: DC

## 2014-08-20 MED ORDER — HYDROCODONE-ACETAMINOPHEN 5-325 MG PO TABS: 1.0000 | ORAL_TABLET | Freq: Four times a day (QID) | ORAL | Status: DC | PRN
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
## 2014-08-20 NOTE — Discharge Instructions (Signed)
Follow up with your md in 1 week for recheck °

## 2014-08-20 NOTE — ED Notes (Signed)
Patient c/o right low back pain x 10 days. Patient states the pain has gotten progressively worse. Patient denies any numbnes or tingling of extremities.

## 2014-09-03 ENCOUNTER — Ambulatory Visit: Payer: Self-pay | Admitting: Internal Medicine

## 2014-09-17 ENCOUNTER — Ambulatory Visit: Payer: Self-pay | Attending: Internal Medicine | Admitting: Internal Medicine

## 2014-09-17 ENCOUNTER — Encounter: Payer: Self-pay | Admitting: Internal Medicine

## 2014-09-17 DIAGNOSIS — E118 Type 2 diabetes mellitus with unspecified complications: Secondary | ICD-10-CM

## 2014-09-17 DIAGNOSIS — M5441 Lumbago with sciatica, right side: Secondary | ICD-10-CM | POA: Insufficient documentation

## 2014-09-17 DIAGNOSIS — E11319 Type 2 diabetes mellitus with unspecified diabetic retinopathy without macular edema: Secondary | ICD-10-CM | POA: Insufficient documentation

## 2014-09-17 LAB — GLUCOSE, POCT (MANUAL RESULT ENTRY): POC Glucose: 357 mg/dl — AB (ref 70–99)

## 2014-09-17 LAB — POCT GLYCOSYLATED HEMOGLOBIN (HGB A1C): HEMOGLOBIN A1C: 10.5

## 2014-09-17 MED ORDER — CYCLOBENZAPRINE HCL 10 MG PO TABS: 10.0000 mg | ORAL_TABLET | Freq: Three times a day (TID) | ORAL | Status: DC | PRN

## 2014-09-17 MED ORDER — ACETAMINOPHEN-CODEINE #3 300-30 MG PO TABS: 1.0000 | ORAL_TABLET | ORAL | Status: DC | PRN

## 2014-09-17 NOTE — Patient Instructions (Signed)
DASH Eating Plan DASH stands for "Dietary Approaches to Stop Hypertension." The DASH eating plan is a healthy eating plan that has been shown to reduce high blood pressure (hypertension). Additional health benefits may include reducing the risk of type 2 diabetes mellitus, heart disease, and stroke. The DASH eating plan may also help with weight loss. WHAT DO I NEED TO KNOW ABOUT THE DASH EATING PLAN? For the DASH eating plan, you will follow these general guidelines:  Choose foods with a percent daily value for sodium of less than 5% (as listed on the food label).  Use salt-free seasonings or herbs instead of table salt or sea salt.  Check with your health care provider or pharmacist before using salt substitutes.  Eat lower-sodium products, often labeled as "lower sodium" or "no salt added."  Eat fresh foods.  Eat more vegetables, fruits, and low-fat dairy products.  Choose whole grains. Look for the word "whole" as the first word in the ingredient list.  Choose fish and skinless chicken or turkey more often than red meat. Limit fish, poultry, and meat to 6 oz (170 g) each day.  Limit sweets, desserts, sugars, and sugary drinks.  Choose heart-healthy fats.  Limit cheese to 1 oz (28 g) per day.  Eat more home-cooked food and less restaurant, buffet, and fast food.  Limit fried foods.  Cook foods using methods other than frying.  Limit canned vegetables. If you do use them, rinse them well to decrease the sodium.  When eating at a restaurant, ask that your food be prepared with less salt, or no salt if possible. WHAT FOODS CAN I EAT? Seek help from a dietitian for individual calorie needs. Grains Whole grain or whole wheat bread. Brown rice. Whole grain or whole wheat pasta. Quinoa, bulgur, and whole grain cereals. Low-sodium cereals. Corn or whole wheat flour tortillas. Whole grain cornbread. Whole grain crackers. Low-sodium crackers. Vegetables Fresh or frozen vegetables  (raw, steamed, roasted, or grilled). Low-sodium or reduced-sodium tomato and vegetable juices. Low-sodium or reduced-sodium tomato sauce and paste. Low-sodium or reduced-sodium canned vegetables.  Fruits All fresh, canned (in natural juice), or frozen fruits. Meat and Other Protein Products Ground beef (85% or leaner), grass-fed beef, or beef trimmed of fat. Skinless chicken or turkey. Ground chicken or turkey. Pork trimmed of fat. All fish and seafood. Eggs. Dried beans, peas, or lentils. Unsalted nuts and seeds. Unsalted canned beans. Dairy Low-fat dairy products, such as skim or 1% milk, 2% or reduced-fat cheeses, low-fat ricotta or cottage cheese, or plain low-fat yogurt. Low-sodium or reduced-sodium cheeses. Fats and Oils Tub margarines without trans fats. Light or reduced-fat mayonnaise and salad dressings (reduced sodium). Avocado. Safflower, olive, or canola oils. Natural peanut or almond butter. Other Unsalted popcorn and pretzels. The items listed above may not be a complete list of recommended foods or beverages. Contact your dietitian for more options. WHAT FOODS ARE NOT RECOMMENDED? Grains White bread. White pasta. White rice. Refined cornbread. Bagels and croissants. Crackers that contain trans fat. Vegetables Creamed or fried vegetables. Vegetables in a cheese sauce. Regular canned vegetables. Regular canned tomato sauce and paste. Regular tomato and vegetable juices. Fruits Dried fruits. Canned fruit in light or heavy syrup. Fruit juice. Meat and Other Protein Products Fatty cuts of meat. Ribs, chicken wings, bacon, sausage, bologna, salami, chitterlings, fatback, hot dogs, bratwurst, and packaged luncheon meats. Salted nuts and seeds. Canned beans with salt. Dairy Whole or 2% milk, cream, half-and-half, and cream cheese. Whole-fat or sweetened yogurt. Full-fat   cheeses or blue cheese. Nondairy creamers and whipped toppings. Processed cheese, cheese spreads, or cheese  curds. Condiments Onion and garlic salt, seasoned salt, table salt, and sea salt. Canned and packaged gravies. Worcestershire sauce. Tartar sauce. Barbecue sauce. Teriyaki sauce. Soy sauce, including reduced sodium. Steak sauce. Fish sauce. Oyster sauce. Cocktail sauce. Horseradish. Ketchup and mustard. Meat flavorings and tenderizers. Bouillon cubes. Hot sauce. Tabasco sauce. Marinades. Taco seasonings. Relishes. Fats and Oils Butter, stick margarine, lard, shortening, ghee, and bacon fat. Coconut, palm kernel, or palm oils. Regular salad dressings. Other Pickles and olives. Salted popcorn and pretzels. The items listed above may not be a complete list of foods and beverages to avoid. Contact your dietitian for more information. WHERE CAN I FIND MORE INFORMATION? National Heart, [employer], [employer]: www.nhlbi.nih.gov/health/health-topics/topics/dash/ Document Released: 06/03/2011 Document Revised: 10/29/2013 Document Reviewed: 04/18/2013 ExitCare Patient Information 2015 [employer]. This information is not intended to replace advice given to you by your health care provider. Make sure you discuss any questions you have with your health care provider. Diabetes and Exercise Exercising regularly is important. It is not just about losing weight. It has many health benefits, such as:  Improving your overall fitness, flexibility, and endurance.  Increasing your bone density.  Helping with weight control.  Decreasing your body fat.  Increasing your muscle strength.  Reducing stress and tension.  Improving your overall health. People with diabetes who exercise gain additional benefits because exercise:  Reduces appetite.  Improves the body's use of blood sugar (glucose).  Helps lower or control blood glucose.  Decreases blood pressure.  Helps control blood lipids (such as cholesterol and triglycerides).  Improves the body's use of the hormone insulin by:  Increasing the  body's insulin sensitivity.  Reducing the body's insulin needs.  Decreases the risk for heart disease because exercising:  Lowers cholesterol and triglycerides levels.  Increases the levels of good cholesterol (such as high-density lipoproteins [HDL]) in the body.  Lowers blood glucose levels. YOUR ACTIVITY PLAN  Choose an activity that you enjoy and set realistic goals. Your health care provider or diabetes educator can help you make an activity plan that works for you. Exercise regularly as directed by your health care provider. This includes:  Performing resistance training twice a week such as push-ups, sit-ups, lifting weights, or using resistance bands.  Performing 150 minutes of cardio exercises each week such as walking, running, or playing sports.  Staying active and spending no more than 90 minutes at one time being inactive. Even short bursts of exercise are good for you. Three 10-minute sessions spread throughout the day are just as beneficial as a single 30-minute session. Some exercise ideas include:  Taking the dog for a walk.  Taking the stairs instead of the elevator.  Dancing to your favorite song.  Doing an exercise video.  Doing your favorite exercise with a friend. RECOMMENDATIONS FOR EXERCISING WITH TYPE 1 OR TYPE 2 DIABETES   Check your blood glucose before exercising. If blood glucose levels are greater than 240 mg/dL, check for urine ketones. Do not exercise if ketones are present.  Avoid injecting insulin into areas of the body that are going to be exercised. For example, avoid injecting insulin into:  The arms when playing tennis.  The legs when jogging.  Keep a record of:  Food intake before and after you exercise.  Expected peak times of insulin action.  Blood glucose levels before and after you exercise.  The type and amount of exercise   you have done.  Review your records with your health care provider. Your health care provider will  help you to develop guidelines for adjusting food intake and insulin amounts before and after exercising.  If you take insulin or oral hypoglycemic agents, watch for signs and symptoms of hypoglycemia. They include:  Dizziness.  Shaking.  Sweating.  Chills.  Confusion.  Drink plenty of water while you exercise to prevent dehydration or heat stroke. Body water is lost during exercise and must be replaced.  Talk to your health care provider before starting an exercise program to make sure it is safe for you. Remember, almost any type of activity is better than none. Document Released: 09/04/2003 Document Revised: 10/29/2013 Document Reviewed: 11/21/2012 ExitCare Patient Information 2015 [employer]. This information is not intended to replace advice given to you by your health care provider. Make sure you discuss any questions you have with your health care provider. Basic Carbohydrate Counting for Diabetes Mellitus Carbohydrate counting is a method for keeping track of the amount of carbohydrates you eat. Eating carbohydrates naturally increases the level of sugar (glucose) in your blood, so it is important for you to know the amount that is okay for you to have in every meal. Carbohydrate counting helps keep the level of glucose in your blood within normal limits. The amount of carbohydrates allowed is different for every person. A dietitian can help you calculate the amount that is right for you. Once you know the amount of carbohydrates you can have, you can count the carbohydrates in the foods you want to eat. Carbohydrates are found in the following foods:  Grains, such as breads and cereals.  Dried beans and soy products.  Starchy vegetables, such as potatoes, peas, and corn.  Fruit and fruit juices.  Milk and yogurt.  Sweets and snack foods, such as cake, cookies, candy, chips, soft drinks, and fruit drinks. CARBOHYDRATE COUNTING There are two ways to count the  carbohydrates in your food. You can use either of the methods or a combination of both. Reading the "Nutrition Facts" on Packaged Food The "Nutrition Facts" is an area that is included on the labels of almost all packaged food and beverages in the [place]. It includes the serving size of that food or beverage and information about the nutrients in each serving of the food, including the grams (g) of carbohydrate per serving.  Decide the number of servings of this food or beverage that you will be able to eat or drink. Multiply that number of servings by the number of grams of carbohydrate that is listed on the label for that serving. The total will be the amount of carbohydrates you will be having when you eat or drink this food or beverage. Learning Standard Serving Sizes of Food When you eat food that is not packaged or does not include "Nutrition Facts" on the label, you need to measure the servings in order to count the amount of carbohydrates.A serving of most carbohydrate-rich foods contains about 15 g of carbohydrates. The following list includes serving sizes of carbohydrate-rich foods that provide 15 g ofcarbohydrate per serving:   1 slice of bread (1 oz) or 1 six-inch tortilla.    of a hamburger bun or English muffin.  4-6 crackers.   cup unsweetened dry cereal.    cup hot cereal.   cup rice or pasta.    cup mashed potatoes or  of a large baked potato.  1 cup fresh fruit or one   small piece of fruit.    cup canned or frozen fruit or fruit juice.  1 cup milk.   cup plain fat-free yogurt or yogurt sweetened with artificial sweeteners.   cup cooked dried beans or starchy vegetable, such as peas, corn, or potatoes.  Decide the number of standard-size servings that you will eat. Multiply that number of servings by 15 (the grams of carbohydrates in that serving). For example, if you eat 2 cups of strawberries, you will have eaten 2 servings and 30 g of  carbohydrates (2 servings x 15 g = 30 g). For foods such as soups and casseroles, in which more than one food is mixed in, you will need to count the carbohydrates in each food that is included. EXAMPLE OF CARBOHYDRATE COUNTING Sample Dinner  3 oz chicken breast.   cup of brown rice.   cup of corn.  1 cup milk.   1 cup strawberries with sugar-free whipped topping.  Carbohydrate Calculation Step 1: Identify the foods that contain carbohydrates:   Rice.   Corn.   Milk.   Strawberries. Step 2:Calculate the number of servings eaten of each:   2 servings of rice.   1 serving of corn.   1 serving of milk.   1 serving of strawberries. Step 3: Multiply each of those number of servings by 15 g:   2 servings of rice x 15 g = 30 g.   1 serving of corn x 15 g = 15 g.   1 serving of milk x 15 g = 15 g.   1 serving of strawberries x 15 g = 15 g. Step 4: Add together all of the amounts to find the total grams of carbohydrates eaten: 30 g + 15 g + 15 g + 15 g = 75 g. Document Released: 06/14/2005 Document Revised: 10/29/2013 Document Reviewed: 05/11/2013 ExitCare Patient Information 2015 [employer]. This information is not intended to replace advice given to you by your health care provider. Make sure you discuss any questions you have with your health care provider.  
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
## 2014-09-24 ENCOUNTER — Encounter: Payer: Self-pay | Admitting: Physical Therapy

## 2014-09-24 ENCOUNTER — Ambulatory Visit: Payer: No Typology Code available for payment source | Attending: Internal Medicine | Admitting: Physical Therapy

## 2014-09-24 DIAGNOSIS — M5441 Lumbago with sciatica, right side: Secondary | ICD-10-CM | POA: Insufficient documentation

## 2014-09-24 DIAGNOSIS — M62830 Muscle spasm of back: Secondary | ICD-10-CM | POA: Insufficient documentation

## 2014-09-24 DIAGNOSIS — R293 Abnormal posture: Secondary | ICD-10-CM

## 2014-09-24 NOTE — Patient Instructions (Signed)
Leg Extension (Hamstring)   Sit toward front edge of chair, with leg out straight, heel on floor, toes pointing toward body. Keeping back straight, bend forward at hip, breathing out through pursed lips. Return, breathing in. Hold for 30 to 60 seconds Repeat __2_ times. Repeat with other leg. Do _2-3__ sessions per day. Variation: Perform from standing position, with support.   Hamstring Step 2   Left foot relaxed, knee straight, other leg bent, foot flat. Raise straight leg further upward to maximal range. Hold _30__ seconds. Relax leg completely down. Repeat __2-3_ times.  Copyright  VHI. All rights reserved.  Side Twist, Kneeling   Kneel, buttocks on heels. Fold upper body forward from hips. Then reach to each side as far as possible, keeping chest low toward floor. Hold each position _30__ seconds. Repeat __3_ times per session. Do __2_ sessions per day.  Copyright  VHI. All rights reserved.   Cryotherapy Cryotherapy means treatment with cold. Ice or gel packs can be used to reduce both pain and swelling. Ice is the most helpful within the first 24 to 48 hours after an injury or flare-up from overusing a muscle or joint. Sprains, strains, spasms, burning pain, shooting pain, and aches can all be eased with ice. Ice can also be used when recovering from surgery. Ice is effective, has very few side effects, and is safe for most people to use. PRECAUTIONS  Ice is not a safe treatment option for people with:  Raynaud phenomenon. This is a condition affecting small blood vessels in the extremities. Exposure to cold may cause your problems to return.  Cold hypersensitivity. There are many forms of cold hypersensitivity, including:  Cold urticaria. Red, itchy hives appear on the skin when the tissues begin to warm after being iced.  Cold erythema. This is a red, itchy rash caused by exposure to cold.  Cold hemoglobinuria. Red blood cells break down when the tissues begin to warm  after being iced. The hemoglobin that carry oxygen are passed into the urine because they cannot combine with blood proteins fast enough.  Numbness or altered sensitivity in the area being iced. If you have any of the following conditions, do not use ice until you have discussed cryotherapy with your caregiver:  Heart conditions, such as arrhythmia, angina, or chronic heart disease.  High blood pressure.  Healing wounds or open skin in the area being iced.  Current infections.  Rheumatoid arthritis.  Poor circulation.  Diabetes. Ice slows the blood flow in the region it is applied. This is beneficial when trying to stop inflamed tissues from spreading irritating chemicals to surrounding tissues. However, if you expose your skin to cold temperatures for too long or without the proper protection, you can damage your skin or nerves. Watch for signs of skin damage due to cold. HOME CARE INSTRUCTIONS Follow these tips to use ice and cold packs safely.  Place a dry or damp towel between the ice and skin. A damp towel will cool the skin more quickly, so you may need to shorten the time that the ice is used.  For a more rapid response, add gentle compression to the ice.  Ice for no more than 10 to 20 minutes at a time. The bonier the area you are icing, the less time it will take to get the benefits of ice.  Check your skin after 5 minutes to make sure there are no signs of a poor response to cold or skin damage.  Rest 20  minutes or more between uses.  Once your skin is numb, you can end your treatment. You can test numbness by very lightly touching your skin. The touch should be so light that you do not see the skin dimple from the pressure of your fingertip. When using ice, most people will feel these normal sensations in this order: cold, burning, aching, and numbness.  Do not use ice on someone who cannot communicate their responses to pain, such as small children or people with  dementia. HOW TO MAKE AN ICE PACK Ice packs are the most common way to use ice therapy. Other methods include ice massage, ice baths, and cryosprays. Muscle creams that cause a cold, tingly feeling do not offer the same benefits that ice offers and should not be used as a substitute unless recommended by your caregiver. To make an ice pack, do one of the following:  Place crushed ice or a bag of frozen vegetables in a sealable plastic bag. Squeeze out the excess air. Place this bag inside another plastic bag. Slide the bag into a pillowcase or place a damp towel between your skin and the bag.  Mix 3 parts water with 1 part rubbing alcohol. Freeze the mixture in a sealable plastic bag. When you remove the mixture from the freezer, it will be slushy. Squeeze out the excess air. Place this bag inside another plastic bag. Slide the bag into a pillowcase or place a damp towel between your skin and the bag. SEEK MEDICAL CARE IF:  You develop white spots on your skin. This may give the skin a blotchy (mottled) appearance.  Your skin turns blue or pale.  Your skin becomes waxy or hard.  Your swelling gets worse. MAKE SURE YOU:   Understand these instructions.  Will watch your condition.  Will get help right away if you are not doing well or get worse. Document Released: 02/08/2011 Document Revised: 10/29/2013 Document Reviewed: 02/08/2011 [facility] Patient Information 2015 [place], [place]. This information is not intended to replace advice given to you by your health care provider. Make sure you discuss any questions you have with your health care provider.
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
## 2014-09-30 ENCOUNTER — Ambulatory Visit: Payer: No Typology Code available for payment source | Admitting: Physical Therapy

## 2014-10-07 ENCOUNTER — Ambulatory Visit: Payer: No Typology Code available for payment source | Attending: Internal Medicine | Admitting: Physical Therapy

## 2014-10-07 DIAGNOSIS — M62830 Muscle spasm of back: Secondary | ICD-10-CM | POA: Insufficient documentation

## 2014-10-07 DIAGNOSIS — R293 Abnormal posture: Secondary | ICD-10-CM | POA: Insufficient documentation

## 2014-10-07 DIAGNOSIS — M5441 Lumbago with sciatica, right side: Secondary | ICD-10-CM | POA: Insufficient documentation

## 2014-10-09 ENCOUNTER — Ambulatory Visit: Payer: No Typology Code available for payment source | Admitting: Physical Therapy

## 2014-10-09 DIAGNOSIS — M62830 Muscle spasm of back: Secondary | ICD-10-CM

## 2014-10-09 DIAGNOSIS — R293 Abnormal posture: Secondary | ICD-10-CM

## 2014-10-09 DIAGNOSIS — M5441 Lumbago with sciatica, right side: Secondary | ICD-10-CM

## 2014-10-09 NOTE — Therapy (Signed)
[facility] [address] [place], [place], [address] Phone: [phone]   Fax:  [phone]  Physical Therapy Treatment  Patient Details  Name: Vincent Osborne MRN: [id number] Date of Birth: 03-29-1966 Referring Provider:  Quentin Angst, MD  Encounter Date: 10/09/2014      PT End of Session - 10/09/14 1502    Visit Number 2   Number of Visits 16   Date for PT Re-Evaluation 11/19/14   PT Start Time 1417   PT Stop Time 1515   PT Time Calculation (min) 58 min   Activity Tolerance Patient tolerated treatment well      Past Medical History  Diagnosis Date  . Diabetes mellitus   . Hypertension   . Whiplash     04/2012  . Hyperlipidemia     Past Surgical History  Procedure Laterality Date  . Appendectomy      There were no vitals filed for this visit.  Visit Diagnosis:  Right-sided low back pain with right-sided sciatica  Back spasm  Abnormal posture      Subjective Assessment - 10/09/14 1421    Subjective Was is alot of pain the next morning after Eval.     Currently in Pain? Yes   Pain Score 8    Pain Location Back   Pain Orientation Right;Lower   Pain Descriptors / Indicators Aching;Sore;Spasm  sore   Pain Radiating Towards foot    Pain Frequency Constant   Aggravating Factors  driving, heated seats turned high, on 5.   Pain Relieving Factors ice                       [facility] Adult PT Treatment/Exercise - 10/09/14 1437    Lumbar Exercises: Stretches   Passive Hamstring Stretch 3 reps;30 seconds   Single Knee to Chest Stretch --  5 reps, 5 sets   Lower Trunk Rotation --  7 reps 10 seconds   Pelvic Tilt 5 reps  5 seconds   Lumbar Exercises: Standing   Wall Slides 10 reps;5 seconds   Lumbar Exercises: Supine   Bridge 10 reps   Knee/Hip Exercises: Stretches   Gastroc Stretch 3 reps;30 seconds   Cryotherapy   Number Minutes Cryotherapy 15 Minutes   Cryotherapy Location Back    Type of Cryotherapy --  cold pack   Electrical Stimulation   Electrical Stimulation Location low back   Electrical Stimulation Action IFC   Electrical Stimulation Parameters to tolerance   Electrical Stimulation Goals Pain                  PT Short Term Goals - 10/09/14 1504    PT SHORT TERM GOAL #1   Title independent with initial HEP   Baseline initial issued today   Time 4   Period Weeks   Status On-going   PT SHORT TERM GOAL #2   Title Report pain decrease from 8/10 to 5/10 for functional actiivities   Time 4   Period Weeks   Status On-going   PT SHORT TERM GOAL #3   Title Demonstrate proper sitting and be more conscious of proper sitting posture throughout the day   Time 4   Status On-going   PT SHORT TERM GOAL #4   Title Pt will be able to sit for 30 minutes without exacerbation of pain and utilizing lumbar roll for driving   Time 4   Period Weeks   Status On-going  PT Long Term Goals - 09/24/14 1009    PT LONG TERM GOAL #1   Title Demonstrate and verbalize techniques to reduce the risk of re-injury including : lifting, posture, body mechanics   Time 8   Period Weeks   Status New   PT LONG TERM GOAL #2   Title Pt will be  independent with advanced HEP   Time 8   Period Weeks   Status New   PT LONG TERM GOAL #3   Title Pt will be 3/10 or less with all functional activities   Time 8   Period Weeks   Status New   PT LONG TERM GOAL #4   Title Pt will tolerate sitting for 90 minutes without increased pain to drive car without increased pain   Time 8   Period Weeks   Status New   PT LONG TERM GOAL #5   Title Pt will not wak due to pain while sleeping at night   Time 8   Period Weeks   Status New   Additional Long Term Goals   Additional Long Term Goals Yes   PT LONG TERM GOAL #6   Title FOTO will improve from 59% limitation to 35% limitation to show improved functional mobility   Time 8   Period Weeks   Status New                Plan - 10/09/14 1503    Clinical Impression Statement progress toward home ex goals.  Able to centralize pain with back extension.   PT Next Visit Plan Review core back program ,  If still with paresthesias /numbness down to foot.  Try traction    Consulted and Agree with Plan of Care Patient        Problem List Patient Active Problem List   Diagnosis Date Noted  . Diabetic retinopathy of both eyes 09/17/2014  . Midline low back pain with right-sided sciatica 09/17/2014  . Hyperglycemia 05/27/2014  . Carbuncle of leg, right 03/26/2014  . Onychomycosis 03/26/2014  . 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 Essential hypertension 12/20/2013  . DM (diabetes mellitus) 09/19/2013  . Left knee pain 09/19/2013  . Acute CVA (cerebrovascular accident) 02/28/2013  . HTN (hypertension) with goal to be determined 02/28/2013  . Diabetes 02/28/2013   Vincent Osborne, PTA 10/09/2014 3:09 PM Phone: [phone] Fax: [phone]  [facility]Vincent Osborne 10/09/2014, 3:09 PM  [facility] [address] [place], [place], [address] Phone: [phone]   Fax:  [phone]

## 2014-10-09 NOTE — Patient Instructions (Addendum)
Bridge   Lie back, legs bent. Inhale, pressing hips up. Keeping ribs in, lengthen lower back. Exhale, rolling down along spine from top. Repeat _10___ times. Do ____ sessions per day.  Copyright  VHI. All rights reserved.   Pelvic Tilt   Flatten back by tightening stomach muscles and buttocks. Repeat __5-10__ times per set. Do __1__ sets per session. Do _1___ sessions per day.  http://orth.exer.us/134   Copyright  VHI. All rights reserved. Knee to Chest (Flexion)   Pull knee toward chest. Feel stretch in lower back or buttock area. Breathing deeply, Hold 5____ seconds. Repeat with other knee. Repeat __10__ times. Do _1___ sessions per day.  http://[email or web address].exer.us/225   Copyright  VHI. All rights reserved.   Lower Trunk Rotation Stretch   Keeping back flat and feet together, rotate knees to left side. Hold 10-20____ seconds. Repeat _3 to 5___ times per set. Do __1__ sets per session. Do _1___ sessions per day. Smaller motions to left to avoid pain.  http://orth.exer.us/122   Copyright  VHI. All rights reserved.  Supine: Leg Stretch With Strap (Basic)   Lie on back with one knee bent, foot flat on floor. Hook strap around other foot. Straighten knee. Keep knee level with other knee. Hold _30__ seconds. Relax leg completely down to floor.  Repeat __3_ times per session. Do 1___ sessions per day.  Copyright  VHI. All rights reserved.  Standing Arch (Extension)   Place hands in small of back. Using hands as fulcrum, arch backward. Try to keep knees straight. Great exercise if sitting makes pain worse. Use to break up long periods of sitting. Repeat __5__ times. Do _1___ sessions per day.  Multiple times  http://[email or web address].exer.us/247   Copyright  VHI. All rights reserved.  Straight Leg Calf Stretch (Gastroc)   Put palms against wall, one leg forward and bent. With other leg back straight and heel flat on floor, lean into wall. Hold _30___ seconds. Change legs and  repeat. Repeat __3__ times. Do _1___ sessions per day.  http://[email or web address].exer.us/419   Copyright  VHI. All rights reserved.   Back Wall Slide   With feet _30__ inches from wall, lean as much of back against the wall as possible. Gently squat down _10__ inches, keeping back against wall. Hold _5-10___ seconds while counting out loud. Repeat __10__ times. Do ___1_ sessions per day.  http://[email or web address].exer.us/563   Copyright  VHI. All rights reserved.

## 2014-10-15 ENCOUNTER — Ambulatory Visit: Payer: No Typology Code available for payment source | Admitting: Physical Therapy

## 2014-10-15 DIAGNOSIS — M62830 Muscle spasm of back: Secondary | ICD-10-CM

## 2014-10-15 DIAGNOSIS — R202 Paresthesia of skin: Secondary | ICD-10-CM

## 2014-10-15 DIAGNOSIS — R293 Abnormal posture: Secondary | ICD-10-CM

## 2014-10-15 DIAGNOSIS — M5441 Lumbago with sciatica, right side: Secondary | ICD-10-CM

## 2014-10-15 NOTE — Patient Instructions (Signed)
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
## 2014-10-17 ENCOUNTER — Ambulatory Visit: Payer: No Typology Code available for payment source | Admitting: Physical Therapy

## 2014-10-17 DIAGNOSIS — M5441 Lumbago with sciatica, right side: Secondary | ICD-10-CM

## 2014-10-17 DIAGNOSIS — R202 Paresthesia of skin: Secondary | ICD-10-CM

## 2014-10-17 DIAGNOSIS — M62830 Muscle spasm of back: Secondary | ICD-10-CM

## 2014-10-17 DIAGNOSIS — R293 Abnormal posture: Secondary | ICD-10-CM

## 2014-10-17 NOTE — Patient Instructions (Addendum)
Trigger Point Dry Needling  . What is Trigger Point Dry Needling (DN)? o DN is a physical therapy technique used to treat muscle pain and dysfunction. Specifically, DN helps deactivate muscle trigger points (muscle knots).  o A thin filiform needle is used to penetrate the skin and stimulate the underlying trigger point. The goal is for a local twitch response (LTR) to occur and for the trigger point to relax. No medication of any kind is injected during the procedure.   . What Does Trigger Point Dry Needling Feel Like?  o The procedure feels different for each individual patient. Some patients report that they do not actually feel the needle enter the skin and overall the process is not painful. Very mild bleeding may occur. However, many patients feel a deep cramping in the muscle in which the needle was inserted. This is the local twitch response.   Marland Kitchen. How Will I feel after the treatment? o Soreness is normal, and the onset of soreness may not occur for a few hours. Typically this soreness does not last longer than two days.  o Bruising is uncommon, however; ice can be used to decrease any possible bruising.  o In rare cases feeling tired or nauseous after the treatment is normal. In addition, your symptoms may get worse before they get better, this period will typically not last longer than 24 hours.   . What Can I do After My Treatment? o Increase your hydration by drinking more water for the next 24 hours. o You may place ice or heat on the areas treated that have become sore, however, do not use heat on inflamed or bruised areas. Heat often brings more relief post needling. o You can continue your regular activities, but vigorous activity is not recommended initially after the treatment for 24 hours. o DN is best combined with other physical therapy such as strengthening, stretching, and other therapies.   Gave handout for child pose exercise  3 reps and 30 seconds  and prone press up  5 x     Garen LahLawrie Niemah Schwebke, PT 10/17/2014 9:12 AM Phone: [phone] Fax: [phone]
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
## 2014-10-22 ENCOUNTER — Ambulatory Visit: Payer: No Typology Code available for payment source | Admitting: Physical Therapy

## 2014-10-22 DIAGNOSIS — R202 Paresthesia of skin: Secondary | ICD-10-CM

## 2014-10-22 DIAGNOSIS — M62830 Muscle spasm of back: Secondary | ICD-10-CM

## 2014-10-22 DIAGNOSIS — M5441 Lumbago with sciatica, right side: Secondary | ICD-10-CM

## 2014-10-22 DIAGNOSIS — R293 Abnormal posture: Secondary | ICD-10-CM
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
## 2014-10-22 NOTE — Patient Instructions (Signed)
Remember to stretch hamstrings and IT band on your back with a strap or belt as shown in clinic  You were given Basic Back Exercise packet.  Remember to bring with you every visit.   Pelvic tilt  10 times 5 sec hold Single limb to chest 10 x 5 sec hold Double knee to chest 10 x 5 sec hold Trunk rotation bilaterally 30 sec hold 2 -3 times each side bilaterally Bridging 2 x 10 Single limb bridge 1 x 10 bil Bent knee 10 x2   Remember posture and body mechanics every day.     Garen LahLawrie Beardsley, PT 10/22/2014 10:07 AM Phone: [phone] Fax: [phone]

## 2014-10-24 ENCOUNTER — Ambulatory Visit: Payer: No Typology Code available for payment source | Admitting: Physical Therapy

## 2014-10-24 DIAGNOSIS — M62830 Muscle spasm of back: Secondary | ICD-10-CM

## 2014-10-24 DIAGNOSIS — R202 Paresthesia of skin: Secondary | ICD-10-CM

## 2014-10-24 DIAGNOSIS — R293 Abnormal posture: Secondary | ICD-10-CM

## 2014-10-24 DIAGNOSIS — M5441 Lumbago with sciatica, right side: Secondary | ICD-10-CM

## 2014-10-24 NOTE — Patient Instructions (Signed)
[facility]   Lie on back, legs bent, arms toward ceiling. Exhale, pressing arms down to sides, curling up head and upper torso. Hold. Pump arms in small flutters up and down. ____ pumps per inhale, __10__ pumps per exhale. Repeat _10___ times. Do 1____ sessions per day.  Copyright  VHI. All rights reserved.
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
## 2014-10-29 ENCOUNTER — Ambulatory Visit: Payer: No Typology Code available for payment source | Attending: Internal Medicine | Admitting: Physical Therapy

## 2014-10-29 DIAGNOSIS — M5441 Lumbago with sciatica, right side: Secondary | ICD-10-CM | POA: Insufficient documentation

## 2014-10-29 DIAGNOSIS — R202 Paresthesia of skin: Secondary | ICD-10-CM

## 2014-10-29 DIAGNOSIS — R293 Abnormal posture: Secondary | ICD-10-CM | POA: Insufficient documentation

## 2014-10-29 DIAGNOSIS — M62830 Muscle spasm of back: Secondary | ICD-10-CM | POA: Insufficient documentation
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
## 2014-10-31 ENCOUNTER — Ambulatory Visit: Payer: No Typology Code available for payment source | Admitting: Physical Therapy

## 2014-10-31 DIAGNOSIS — M62830 Muscle spasm of back: Secondary | ICD-10-CM

## 2014-10-31 DIAGNOSIS — R293 Abnormal posture: Secondary | ICD-10-CM
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
## 2016-04-12 ENCOUNTER — Emergency Department (HOSPITAL_COMMUNITY)
Admission: EM | Admit: 2016-04-12 | Discharge: 2016-04-12 | Disposition: A | Discharge status: Home / Self Care | Payer: No Typology Code available for payment source | Attending: Emergency Medicine | Admitting: Emergency Medicine

## 2016-04-12 ENCOUNTER — Encounter (HOSPITAL_COMMUNITY): Payer: Self-pay | Admitting: Emergency Medicine

## 2016-04-12 DIAGNOSIS — E119 Type 2 diabetes mellitus without complications: Secondary | ICD-10-CM | POA: Insufficient documentation

## 2016-04-12 DIAGNOSIS — Z7984 Long term (current) use of oral hypoglycemic drugs: Secondary | ICD-10-CM | POA: Insufficient documentation

## 2016-04-12 DIAGNOSIS — I10 Essential (primary) hypertension: Secondary | ICD-10-CM | POA: Insufficient documentation

## 2016-04-12 DIAGNOSIS — Z79899 Other long term (current) drug therapy: Secondary | ICD-10-CM | POA: Insufficient documentation

## 2016-04-12 DIAGNOSIS — Z7982 Long term (current) use of aspirin: Secondary | ICD-10-CM | POA: Insufficient documentation

## 2016-04-12 DIAGNOSIS — R600 Localized edema: Secondary | ICD-10-CM | POA: Insufficient documentation

## 2016-04-12 DIAGNOSIS — R609 Edema, unspecified: Secondary | ICD-10-CM

## 2016-04-12 DIAGNOSIS — M5441 Lumbago with sciatica, right side: Secondary | ICD-10-CM | POA: Insufficient documentation

## 2016-04-12 LAB — URINE MICROSCOPIC-ADD ON
BACTERIA UA: NONE SEEN
RBC / HPF: NONE SEEN RBC/hpf (ref 0–5)
WBC, UA: NONE SEEN WBC/hpf (ref 0–5)

## 2016-04-12 LAB — URINALYSIS, ROUTINE W REFLEX MICROSCOPIC
Bilirubin Urine: NEGATIVE
Ketones, ur: NEGATIVE mg/dL
Leukocytes, UA: NEGATIVE
Nitrite: NEGATIVE
Specific Gravity, Urine: 1.03 (ref 1.005–1.030)
pH: 6 (ref 5.0–8.0)

## 2016-04-12 LAB — CBG MONITORING, ED: Glucose-Capillary: 166 mg/dL — ABNORMAL HIGH (ref 65–99)

## 2016-04-12 MED ORDER — CYCLOBENZAPRINE HCL 10 MG PO TABS: 10.0000 mg | ORAL_TABLET | Freq: Two times a day (BID) | ORAL | 0 refills | Status: DC | PRN

## 2016-04-12 MED ORDER — TRAMADOL HCL 50 MG PO TABS: 50.0000 mg | ORAL_TABLET | Freq: Two times a day (BID) | ORAL | 0 refills | Status: DC | PRN

## 2016-04-12 MED ORDER — KETOROLAC TROMETHAMINE 60 MG/2ML IM SOLN
60.0000 mg | Freq: Once | INTRAMUSCULAR | Status: AC
  Administered 2016-04-12: 60 mg via INTRAMUSCULAR
  Filled 2016-04-12: qty 2

## 2016-04-12 MED ORDER — PREDNISONE 20 MG PO TABS: 40.0000 mg | ORAL_TABLET | Freq: Every day | ORAL | 0 refills | Status: DC

## 2016-04-12 MED ORDER — CYCLOBENZAPRINE HCL 10 MG PO TABS
10.0000 mg | ORAL_TABLET | Freq: Once | ORAL | Status: AC
  Administered 2016-04-12: 10 mg via ORAL
  Filled 2016-04-12: qty 1

## 2016-04-12 MED ORDER — PREDNISONE 20 MG PO TABS
60.0000 mg | ORAL_TABLET | Freq: Once | ORAL | Status: AC
  Administered 2016-04-12: 60 mg via ORAL
  Filled 2016-04-12: qty 3

## 2016-04-12 NOTE — ED Provider Notes (Signed)
[facility]-EMERGENCY DEPT Provider Note   CSN: [id number] Arrival date & time: 04/12/16  2956     History   Chief Complaint Chief Complaint  Patient presents with  . Back Pain    HPI Vincent Osborne is a 50 y.o. male.  HPI   Patient is a 50 year old male history of diabetes, hyperlipidemia, hypertension, he presents to the ER complaining of sciatic flare. He states he has been sleeping in his car and his low back pain has gradually worsened in the past 3-4 days. It pain is located in the bilateral low back with radiation down his right leg to his foot. Pain as burning and stinging dizziness and numbness and tingling that is intermittent, pain rated 10/10.  He does have a history of diabetic neuropathy in the past was prescribed gabapentin and is not currently taking in. His pain does swell somewhat similar to his neuropathy. His pain also is consistent with past sciatica flares.  He also has bilateral lower extremity and feet swelling since she has been sleeping in his car and he is not able to elevate his the night.  He denies orthopnea, exertional dyspnea. He denies fever, chills, saddle anesthesia, fever, difficulty urinating, urinary retention, cancer history or history of IV drug use.  Past Medical History:  Diagnosis Date  . Diabetes mellitus   . Hyperlipidemia   . Hypertension   . Whiplash    04/2012    Patient Active Problem List   Diagnosis Date Noted  . Diabetic retinopathy of both eyes (HCC) 09/17/2014  . Midline low back pain with right-sided sciatica 09/17/2014  . Hyperglycemia 05/27/2014  . Carbuncle of leg, right 03/26/2014  . Onychomycosis 03/26/2014  . 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 Essential hypertension 12/20/2013  . DM (diabetes mellitus) (HCC) 09/19/2013  . Left knee pain 09/19/2013  . Acute CVA (cerebrovascular accident) (HCC) 02/28/2013  . HTN (hypertension) with goal to be determined 02/28/2013  . Diabetes (HCC) 02/28/2013      Past Surgical History:  Procedure Laterality Date  . APPENDECTOMY         Home Medications    Prior to Admission medications   Medication Sig Start Date End Date Taking? Authorizing Provider  acetaminophen-codeine (TYLENOL #3) 300-30 MG per tablet Take 1 tablet by mouth every 4 (four) hours as needed. 09/17/14   Quentin Angst, MD  aspirin 81 MG tablet Take 81 mg by mouth daily.    Historical Provider, MD  celecoxib (CELEBREX) 200 MG capsule Take 1 capsule (200 mg total) by mouth 2 (two) times daily. 08/20/14   Bethann Berkshire, MD  cyclobenzaprine (FLEXERIL) 10 MG tablet Take 1 tablet (10 mg total) by mouth 3 (three) times daily as needed for muscle spasms. 09/17/14   Quentin Angst, MD  cyclobenzaprine (FLEXERIL) 10 MG tablet Take 1 tablet (10 mg total) by mouth 2 (two) times daily as needed for muscle spasms. 04/12/16   Danelle Berry, PA-C  Dapagliflozin Propanediol (FARXIGA) 10 MG TABS Take 10 mg by mouth daily. 03/26/14   Quentin Angst, MD  HYDROcodone-acetaminophen (NORCO/VICODIN) 5-325 MG per tablet Take 1 tablet by mouth every 6 (six) hours as needed for moderate pain. Patient not taking: Reported on 09/24/2014 08/20/14   Bethann Berkshire, MD  lisinopril-hydrochlorothiazide (ZESTORETIC) 20-12.5 MG per tablet Take 1 tablet by mouth daily. 03/26/14   Quentin Angst, MD  metFORMIN (GLUCOPHAGE) 500 MG tablet Take 500 mg by mouth 2 (two) times daily with a meal.  Historical Provider, MD  mupirocin nasal ointment (BACTROBAN) 2 % Apply in each nostril daily Patient not taking: Reported on 05/27/2014 04/10/14   Nelva Nayobert Beaton, MD  predniSONE (DELTASONE) 20 MG tablet Take 2 tablets (40 mg total) by mouth daily. Take 40 mg by mouth daily for 3 days, then 20mg  by mouth daily for 3 days, then 10mg  daily for 3 days 04/12/16   Danelle BerryLeisa Keith Cancio, PA-C  simvastatin (ZOCOR) 20 MG tablet Take 1 tablet (20 mg total) by mouth at bedtime. Patient taking differently: Take 20 mg by mouth every  morning.  04/12/14   Quentin Angstlugbemiga E Jegede, MD  sulfamethoxazole-trimethoprim (SEPTRA DS) 800-160 MG per tablet Take 1 tablet by mouth every 12 (twelve) hours. Patient not taking: Reported on 05/27/2014 05/22/14   Tatyana Kirichenko, PA-C  terbinafine (LAMISIL AT) 1 % cream Apply 1 application topically 2 (two) times daily. Patient taking differently: Apply 1 application topically 2 (two) times daily as needed (toe fungus).  03/26/14   Quentin Angstlugbemiga E Jegede, MD  terbinafine (LAMISIL) 250 MG tablet Take 1 tablet (250 mg total) by mouth daily. Patient not taking: Reported on 05/27/2014 03/26/14   Quentin Angstlugbemiga E Jegede, MD  traMADol (ULTRAM) 50 MG tablet Take 1 tablet (50 mg total) by mouth every 12 (twelve) hours as needed for severe pain. 04/12/16   Danelle BerryLeisa Jaslynne Dahan, PA-C    Family History Family History  Problem Relation Age of Onset  . Diabetes Mother   . Diabetes Brother   . Hypertension Maternal Uncle     Social History Social History  Substance Use Topics  . Smoking status: Never Smoker  . Smokeless tobacco: Never Used  . Alcohol use No     Allergies   Review of patient's allergies indicates no known allergies.   Review of Systems Review of Systems  All other systems reviewed and are negative.    Physical Exam Updated Vital Signs BP 158/100 (BP Location: Left Arm)   Pulse 82   Temp 98.1 F (36.7 C) (Oral)   Resp 18   Ht 6' (1.829 m)   Wt 99.8 kg   SpO2 100%   BMI 29.84 kg/m   Physical Exam  Constitutional: He is oriented to person, place, and time. He appears well-developed and well-nourished. No distress.  HENT:  Head: Normocephalic and atraumatic.  Right Ear: External ear normal.  Left Ear: External ear normal.  Nose: Nose normal.  Mouth/Throat: Oropharynx is clear and moist. No oropharyngeal exudate.  Eyes: Conjunctivae and EOM are normal. Pupils are equal, round, and reactive to light. Right eye exhibits no discharge. Left eye exhibits no discharge. No scleral  icterus.  Neck: Normal range of motion.  Cardiovascular: Normal rate, regular rhythm, normal heart sounds and intact distal pulses.  Exam reveals no gallop and no friction rub.   No murmur heard. Mild bilateral pretibial edema, 1+ e bilateral pedal edema, nonpitting  Pulmonary/Chest: Effort normal and breath sounds normal. No stridor. No respiratory distress. He has no wheezes. He has no rales. He exhibits no tenderness.  Abdominal: Soft. Bowel sounds are normal. He exhibits no distension and no mass. There is no tenderness. There is no guarding.  No CVA tenderness  Musculoskeletal: Normal range of motion. He exhibits tenderness. He exhibits no edema.  No midline tenderness on cervical to lumbar spine, no paraspinal tenderness Right SI joint and right buttocks TTP  Neurological: He is alert and oriented to person, place, and time. He exhibits normal muscle tone. Coordination normal.  Normal sensation to  light touch in all extremities 5/5 symmetrical grip strength, dorsiflexion and plantar flexion Antalgic gait  Skin: Skin is warm and dry. No rash noted. He is not diaphoretic. No erythema. No pallor.  Psychiatric: He has a normal mood and affect. His behavior is normal. Judgment and thought content normal.  Nursing note and vitals reviewed.    ED Treatments / Results  Labs (all labs ordered are listed, but only abnormal results are displayed) Labs Reviewed  URINALYSIS, ROUTINE W REFLEX MICROSCOPIC (NOT AT [facility]) - Abnormal; Notable for the following:       Result Value   Glucose, UA >1000 (*)    Hgb urine dipstick SMALL (*)    Protein, ur >300 (*)    All other components within normal limits  URINE MICROSCOPIC-ADD ON - Abnormal; Notable for the following:    Squamous Epithelial / LPF 0-5 (*)    All other components within normal limits  CBG MONITORING, ED - Abnormal; Notable for the following:    Glucose-Capillary 166 (*)    All other components within normal limits    EKG  EKG  Interpretation None       Radiology No results found.  Procedures Procedures (including critical care time)  Medications Ordered in ED Medications  ketorolac (TORADOL) injection 60 mg (60 mg Intramuscular Given 04/12/16 0506)  predniSONE (DELTASONE) tablet 60 mg (60 mg Oral Given 04/12/16 0506)  cyclobenzaprine (FLEXERIL) tablet 10 mg (10 mg Oral Given 04/12/16 0506)     Initial Impression / Assessment and Plan / ED Course  I have reviewed the triage vital signs and the nursing notes.  Pertinent labs & imaging results that were available during my care of the patient were reviewed by me and considered in my medical decision making (see chart for details).  Clinical Course   Normal neurological exam, no evidence of urinary incontinence or retention, pain is consistently reproducible. There is no evidence of AAA or concern for dissection at this time.   Patient can walk but states is painful.  No loss of bowel or bladder control.  No concern for cauda equina.  No fever, night sweats, weight loss, h/o cancer, IVDU.  Pain treated here in the department with adequate improvement. RICE protocol and pain medicine indicated and discussed with patient. I have also discussed reasons to return immediately to the ER.  Patient expresses understanding and agrees with plan.  Patient mild bilateral lower extremity edema is likely related to mild dependent edema, also associated with sleeping in his car.  Social resource guide given. He will go to [facility] to get help with medications.    Final Clinical Impressions(s) / ED Diagnoses   Final diagnoses:  Right-sided low back pain with right-sided sciatica, unspecified chronicity  Dependent edema    New Prescriptions New Prescriptions   CYCLOBENZAPRINE (FLEXERIL) 10 MG TABLET    Take 1 tablet (10 mg total) by mouth 2 (two) times daily as needed for muscle spasms.   PREDNISONE (DELTASONE) 20 MG TABLET    Take 2 tablets (40 mg total) by mouth daily.  Take 40 mg by mouth daily for 3 days, then 20mg  by mouth daily for 3 days, then 10mg  daily for 3 days   TRAMADOL (ULTRAM) 50 MG TABLET    Take 1 tablet (50 mg total) by mouth every 12 (twelve) hours as needed for severe pain.     Danelle Berry, PA-C 04/12/16 4540    Geoffery Lyons, MD 04/12/16 [phone]

## 2016-04-12 NOTE — ED Triage Notes (Signed)
Pt reports lower back pain with radiation into right leg and feet. Pt reports prior hx of sciatica and no recent injury.

## 2016-04-12 NOTE — ED Notes (Signed)
Bed: WA04 Expected date:  Expected time:  Means of arrival:  Comments: 

## 2016-04-12 NOTE — ED Notes (Signed)
Patient is alert and oriented x3.  He was given DC instructions and follow up visit instructions.  Patient gave verbal understanding.  He was DC ambulatory under his own power to home.  V/S stable.  He was not showing any signs of distress on DC 

## 2016-05-21 NOTE — Congregational Nurse Program (Signed)
Congregational Nurse Program Note  Date of Encounter: 05/19/2016  Past Medical History: Past Medical History:  Diagnosis Date  . Diabetes mellitus   . Hyperlipidemia   . Hypertension   . Whiplash    04/2012    Encounter Details:     CNP Questionnaire - 05/21/16 1137      Patient Demographics   Is this a new or existing patient? New   Patient is considered a/an Not Applicable   Race African-American/Black     Patient Assistance   Location of Patient Assistance Not Applicable   Patient's financial/insurance status Low Income;Self-Pay (Uninsured)   Uninsured Patient ([place] Card/Care Connects) Yes   Interventions Assisted patient in making appt.   Patient referred to apply for the following financial assistance [employer]   Food insecurities addressed Not Technical brewerApplicable   Transportation assistance No   Assistance securing medications No   Sales promotion account executiveducational health offerings Behavioral health     Encounter Details   Primary purpose of visit Navigating the Healthcare System;Spiritual Care/Support Visit   Was an Emergency Department visit averted? Not Applicable   Does patient have a medical provider? No   Patient referred to Area Agency   Was a mental health screening completed? (GAINS tool) No   Does patient have dental issues? No   Does patient have vision issues? No   Does your patient have an abnormal blood pressure today? No   Since previous encounter, have you referred patient for abnormal blood pressure that resulted in a new diagnosis or medication change? No   Does your patient have an abnormal blood glucose today? No   Since previous encounter, have you referred patient for abnormal blood glucose that resulted in a new diagnosis or medication change? No   Was there a life-saving intervention made? No     Requesting assistance with obtaining counseling.  States is experiencing more depression related to increase stress over past few days.  States he recently  won the lottery in the amount of 5,000 which was then garnished due to an unpaid bill.  Felt like this was his way out of homelessness.  Now feels "stuck".  [facility] and encouraged client to return to see me for ongoing support

## 2016-07-02 ENCOUNTER — Emergency Department (HOSPITAL_COMMUNITY): Payer: No Typology Code available for payment source

## 2016-07-02 ENCOUNTER — Encounter (HOSPITAL_COMMUNITY): Payer: Self-pay

## 2016-07-02 ENCOUNTER — Emergency Department (HOSPITAL_COMMUNITY)
Admission: EM | Admit: 2016-07-02 | Discharge: 2016-07-02 | Disposition: A | Discharge status: Home / Self Care | Payer: No Typology Code available for payment source | Attending: Emergency Medicine | Admitting: Emergency Medicine

## 2016-07-02 DIAGNOSIS — E113293 Type 2 diabetes mellitus with mild nonproliferative diabetic retinopathy without macular edema, bilateral: Secondary | ICD-10-CM | POA: Insufficient documentation

## 2016-07-02 DIAGNOSIS — E1165 Type 2 diabetes mellitus with hyperglycemia: Secondary | ICD-10-CM

## 2016-07-02 DIAGNOSIS — Z7982 Long term (current) use of aspirin: Secondary | ICD-10-CM | POA: Insufficient documentation

## 2016-07-02 DIAGNOSIS — Z7984 Long term (current) use of oral hypoglycemic drugs: Secondary | ICD-10-CM | POA: Insufficient documentation

## 2016-07-02 DIAGNOSIS — Z8673 Personal history of transient ischemic attack (TIA), and cerebral infarction without residual deficits: Secondary | ICD-10-CM | POA: Insufficient documentation

## 2016-07-02 DIAGNOSIS — G9341 Metabolic encephalopathy: Secondary | ICD-10-CM | POA: Insufficient documentation

## 2016-07-02 DIAGNOSIS — I10 Essential (primary) hypertension: Secondary | ICD-10-CM | POA: Insufficient documentation

## 2016-07-02 DIAGNOSIS — Z79899 Other long term (current) drug therapy: Secondary | ICD-10-CM | POA: Insufficient documentation

## 2016-07-02 LAB — URINALYSIS, ROUTINE W REFLEX MICROSCOPIC
BILIRUBIN URINE: NEGATIVE
Bacteria, UA: NONE SEEN
Ketones, ur: NEGATIVE mg/dL
Leukocytes, UA: NEGATIVE
NITRITE: NEGATIVE
Protein, ur: >=300 mg/dL — AB
SPECIFIC GRAVITY, URINE: 1.02 (ref 1.005–1.030)
Squamous Epithelial / LPF: NONE SEEN
pH: 7 (ref 5.0–8.0)

## 2016-07-02 LAB — CBC WITH DIFFERENTIAL/PLATELET
BASOS ABS: 0 10*3/uL (ref 0.0–0.1)
BASOS PCT: 0 %
Eosinophils Absolute: 0.1 10*3/uL (ref 0.0–0.7)
Eosinophils Relative: 1 %
HEMATOCRIT: 37.8 % — AB (ref 39.0–52.0)
Hemoglobin: 12.6 g/dL — ABNORMAL LOW (ref 13.0–17.0)
Lymphocytes Relative: 27 %
Lymphs Abs: 1.9 10*3/uL (ref 0.7–4.0)
MCH: 27.1 pg (ref 26.0–34.0)
MCHC: 33.3 g/dL (ref 30.0–36.0)
MCV: 81.3 fL (ref 78.0–100.0)
Monocytes Absolute: 0.5 10*3/uL (ref 0.1–1.0)
Monocytes Relative: 8 %
NEUTROS PCT: 64 %
Neutro Abs: 4.5 10*3/uL (ref 1.7–7.7)
Platelets: 215 10*3/uL (ref 150–400)
RBC: 4.65 MIL/uL (ref 4.22–5.81)
RDW: 12.4 % (ref 11.5–15.5)
WBC: 7.1 10*3/uL (ref 4.0–10.5)

## 2016-07-02 LAB — CBG MONITORING, ED
GLUCOSE-CAPILLARY: 350 mg/dL — AB (ref 65–99)
Glucose-Capillary: 98 mg/dL (ref 65–99)

## 2016-07-02 LAB — COMPREHENSIVE METABOLIC PANEL
ALBUMIN: 2.9 g/dL — AB (ref 3.5–5.0)
ALT: 14 U/L — AB (ref 17–63)
AST: 17 U/L (ref 15–41)
Alkaline Phosphatase: 50 U/L (ref 38–126)
Anion gap: 5 (ref 5–15)
BUN: 11 mg/dL (ref 6–20)
CO2: 27 mmol/L (ref 22–32)
Calcium: 9 mg/dL (ref 8.9–10.3)
Chloride: 105 mmol/L (ref 101–111)
Creatinine, Ser: 1.49 mg/dL — ABNORMAL HIGH (ref 0.61–1.24)
GFR calc Af Amer: >60 mL/min (ref >60)
GFR calc non Af Amer: 53 mL/min — ABNORMAL LOW (ref >60)
GLUCOSE: 359 mg/dL — AB (ref 65–99)
Potassium: 4.1 mmol/L (ref 3.5–5.1)
Sodium: 137 mmol/L (ref 135–145)
Total Bilirubin: 0.3 mg/dL (ref 0.3–1.2)
Total Protein: 5.8 g/dL — ABNORMAL LOW (ref 6.5–8.1)

## 2016-07-02 LAB — ETHANOL: Alcohol, Ethyl (B): <5 mg/dL (ref <5)

## 2016-07-02 LAB — I-STAT CG4 LACTIC ACID, ED: LACTIC ACID, VENOUS: 1.43 mmol/L (ref 0.5–1.9)

## 2016-07-02 LAB — RAPID URINE DRUG SCREEN, HOSP PERFORMED
Amphetamines: NOT DETECTED
BARBITURATES: NOT DETECTED
BENZODIAZEPINES: NOT DETECTED
Cocaine: NOT DETECTED
Opiates: NOT DETECTED
TETRAHYDROCANNABINOL: NOT DETECTED

## 2016-07-02 LAB — TROPONIN I: Troponin I: <0.03 ng/mL (ref <0.03)

## 2016-07-02 MED ORDER — METFORMIN HCL 500 MG PO TABS
500.0000 mg | ORAL_TABLET | Freq: Once | ORAL | Status: AC
  Administered 2016-07-02: 500 mg via ORAL
  Filled 2016-07-02: qty 1

## 2016-07-02 MED ORDER — METFORMIN HCL 500 MG PO TABS: 500.0000 mg | ORAL_TABLET | Freq: Two times a day (BID) | ORAL | 0 refills | Status: DC

## 2016-07-02 MED ORDER — SODIUM CHLORIDE 0.9 % IV SOLN: INTRAVENOUS | Status: DC

## 2016-07-02 MED ORDER — SODIUM CHLORIDE 0.9 % IV BOLUS (SEPSIS)
1000.0000 mL | Freq: Once | INTRAVENOUS | Status: AC
  Administered 2016-07-02: 1000 mL via INTRAVENOUS

## 2016-07-02 MED ORDER — HYDROCHLOROTHIAZIDE 12.5 MG PO CAPS
12.5000 mg | ORAL_CAPSULE | Freq: Every day | ORAL | Status: DC
  Administered 2016-07-02: 12.5 mg via ORAL
  Filled 2016-07-02: qty 1

## 2016-07-02 MED ORDER — LISINOPRIL 20 MG PO TABS
20.0000 mg | ORAL_TABLET | Freq: Once | ORAL | Status: AC
  Administered 2016-07-02: 20 mg via ORAL
  Filled 2016-07-02: qty 1

## 2016-07-02 MED ORDER — INSULIN ASPART 100 UNIT/ML ~~LOC~~ SOLN
10.0000 [IU] | Freq: Once | SUBCUTANEOUS | Status: AC
  Administered 2016-07-02: 10 [IU] via INTRAVENOUS
  Filled 2016-07-02: qty 1

## 2016-07-02 MED ORDER — LISINOPRIL-HYDROCHLOROTHIAZIDE 20-12.5 MG PO TABS: 1.0000 | ORAL_TABLET | Freq: Every day | ORAL | 0 refills | Status: DC

## 2016-07-02 NOTE — ED Provider Notes (Signed)
[facility]-EMERGENCY DEPT Provider Note   CSN: [id number] Arrival date & time: 07/02/16  [id number]     History   Chief Complaint Chief Complaint  Patient presents with  . Altered Mental Status    HPI Vincent Osborne is a 51 y.o. male.  Pt presents to the ED today with confusion.  [facility].  He has not been taking any of his medications.  He is not sure if he has his medications or not.  He does not know why he has not been taking the meds.  The pt denies any pain.  He said that he feels just confused.  He thinks it started on 1/1.      Past Medical History:  Diagnosis Date  . Diabetes mellitus   . Hyperlipidemia   . Hypertension   . Whiplash    04/2012    Patient Active Problem List   Diagnosis Date Noted  . Diabetic retinopathy of both eyes (HCC) 09/17/2014  . Midline low back pain with right-sided sciatica 09/17/2014  . Hyperglycemia 05/27/2014  . Carbuncle of leg, right 03/26/2014  . Onychomycosis 03/26/2014  . 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 Essential hypertension 12/20/2013  . DM (diabetes mellitus) (HCC) 09/19/2013  . Left knee pain 09/19/2013  . Acute CVA (cerebrovascular accident) (HCC) 02/28/2013  . HTN (hypertension) with goal to be determined 02/28/2013  . Diabetes (HCC) 02/28/2013    Past Surgical History:  Procedure Laterality Date  . APPENDECTOMY         Home Medications    Prior to Admission medications   Medication Sig Start Date End Date Taking? Authorizing Provider  aspirin 81 MG tablet Take 81 mg by mouth daily.   Yes Historical Provider, MD  acetaminophen-codeine (TYLENOL #3) 300-30 MG per tablet Take 1 tablet by mouth every 4 (four) hours as needed. Patient not taking: Reported on 07/02/2016 09/17/14   Quentin Angstlugbemiga E Jegede, MD  celecoxib (CELEBREX) 200 MG capsule Take 1 capsule (200 mg total) by mouth 2 (two) times daily. Patient not taking: Reported on 07/02/2016 08/20/14    Bethann BerkshireJoseph Zammit, MD  cyclobenzaprine (FLEXERIL) 10 MG tablet Take 1 tablet (10 mg total) by mouth 3 (three) times daily as needed for muscle spasms. Patient not taking: Reported on 07/02/2016 09/17/14   Quentin Angstlugbemiga E Jegede, MD  cyclobenzaprine (FLEXERIL) 10 MG tablet Take 1 tablet (10 mg total) by mouth 2 (two) times daily as needed for muscle spasms. Patient not taking: Reported on 07/02/2016 04/12/16   Danelle BerryLeisa Tapia, PA-C  Dapagliflozin Propanediol (FARXIGA) 10 MG TABS Take 10 mg by mouth daily. Patient not taking: Reported on 07/02/2016 03/26/14   Quentin Angstlugbemiga E Jegede, MD  HYDROcodone-acetaminophen (NORCO/VICODIN) 5-325 MG per tablet Take 1 tablet by mouth every 6 (six) hours as needed for moderate pain. Patient not taking: Reported on 09/24/2014 08/20/14   Bethann BerkshireJoseph Zammit, MD  lisinopril-hydrochlorothiazide (ZESTORETIC) 20-12.5 MG tablet Take 1 tablet by mouth daily. 07/02/16   Jacalyn LefevreJulie Ramiro Pangilinan, MD  metFORMIN (GLUCOPHAGE) 500 MG tablet Take 1 tablet (500 mg total) by mouth 2 (two) times daily with a meal. 07/02/16   Jacalyn LefevreJulie Khris Jansson, MD  simvastatin (ZOCOR) 20 MG tablet Take 1 tablet (20 mg total) by mouth at bedtime. Patient not taking: Reported on 07/02/2016 04/12/14   Quentin Angstlugbemiga E Jegede, MD  traMADol (ULTRAM) 50 MG tablet Take 1 tablet (50 mg total) by mouth every 12 (twelve) hours as needed for severe pain. Patient not taking: Reported  on 07/02/2016 04/12/16   Danelle Berry, PA-C    Family History Family History  Problem Relation Age of Onset  . Diabetes Mother   . Diabetes Brother   . Hypertension Maternal Uncle     Social History Social History  Substance Use Topics  . Smoking status: Never Smoker  . Smokeless tobacco: Never Used  . Alcohol use No     Allergies   Penicillins   Review of Systems Review of Systems  Neurological:       Confusion  All other systems reviewed and are negative.    Physical Exam Updated Vital Signs BP 186/97   Pulse 87   Temp 98.8 F (37.1 C) (Oral)   Resp 12    Ht 6\' 2"  (1.88 m)   Wt 200 lb (90.7 kg)   SpO2 99%   BMI 25.68 kg/m   Physical Exam  Constitutional: He is oriented to person, place, and time. He appears well-developed and well-nourished.  HENT:  Head: Normocephalic and atraumatic.  Right Ear: External ear normal.  Left Ear: External ear normal.  Nose: Nose normal.  Mouth/Throat: Oropharynx is clear and moist.  Eyes: Conjunctivae and EOM are normal. Pupils are equal, round, and reactive to light.  Neck: Normal range of motion. Neck supple.  Cardiovascular: Normal rate, regular rhythm, normal heart sounds and intact distal pulses.   Pulmonary/Chest: Effort normal and breath sounds normal.  Abdominal: Soft. Bowel sounds are normal.  Musculoskeletal: Normal range of motion.  Neurological: He is alert and oriented to person, place, and time.  Skin: Skin is warm.  Psychiatric: He has a normal mood and affect. His behavior is normal. Judgment and thought content normal.  Nursing note and vitals reviewed.    ED Treatments / Results  Labs (all labs ordered are listed, but only abnormal results are displayed) Labs Reviewed  CBC WITH DIFFERENTIAL/PLATELET - Abnormal; Notable for the following:       Result Value   Hemoglobin 12.6 (*)    HCT 37.8 (*)    All other components within normal limits  COMPREHENSIVE METABOLIC PANEL - Abnormal; Notable for the following:    Glucose, Bld 359 (*)    Creatinine, Ser 1.49 (*)    Total Protein 5.8 (*)    Albumin 2.9 (*)    ALT 14 (*)    GFR calc non Af Amer 53 (*)    All other components within normal limits  URINALYSIS, ROUTINE W REFLEX MICROSCOPIC - Abnormal; Notable for the following:    Color, Urine STRAW (*)    Glucose, UA >=500 (*)    Hgb urine dipstick SMALL (*)    Protein, ur >=300 (*)    All other components within normal limits  CBG MONITORING, ED - Abnormal; Notable for the following:    Glucose-Capillary 350 (*)    All other components within normal limits  ETHANOL    TROPONIN I  RAPID URINE DRUG SCREEN, HOSP PERFORMED  I-STAT CG4 LACTIC ACID, ED  CBG MONITORING, ED  CBG MONITORING, ED    EKG  EKG Interpretation  Date/Time:  Friday July 02 2016 08:37:21 EST Ventricular Rate:  91 PR Interval:    QRS Duration: 94 QT Interval:  355 QTC Calculation: 437 R Axis:   63 Text Interpretation:  Sinus rhythm Borderline T wave abnormalities Baseline wander in lead(s) V5 V6 Confirmed by Particia Nearing MD, Buffi Ewton ([id number]) on 07/02/2016 9:05:17 AM       Radiology Dg Chest 2 View  Result Date:  07/02/2016 CLINICAL DATA:  Confusion. EXAM: CHEST  2 VIEW COMPARISON:  08/08/2014 FINDINGS: The heart size and mediastinal contours are within normal limits. There is no evidence of pulmonary edema, consolidation, pneumothorax, nodule or pleural fluid. The visualized skeletal structures are unremarkable. IMPRESSION: No active cardiopulmonary disease. Electronically Signed   By: Irish Lack M.D.   On: 07/02/2016 09:15   Ct Head Wo Contrast  Result Date: 07/02/2016 CLINICAL DATA:  Confusion and disorientation over the last 4 days. Headache. EXAM: CT HEAD WITHOUT CONTRAST TECHNIQUE: Contiguous axial images were obtained from the base of the skull through the vertex without intravenous contrast. COMPARISON:  02/28/2013 FINDINGS: Brain: No evidence of malformation, atrophy, old or acute small or large vessel infarction, mass lesion, hemorrhage, hydrocephalus or extra-axial collection. No evidence of pituitary lesion. Vascular: No vascular calcification.  No hyperdense vessels. Skull: Normal.  No fracture or focal bone lesion. Sinuses/Orbits: Visualized sinuses are clear. No fluid in the middle ears or mastoids. Visualized orbits are normal. Other: None significant IMPRESSION: Normal head CT Electronically Signed   By: Paulina Fusi M.D.   On: 07/02/2016 09:16    Procedures Procedures (including critical care time)  Medications Ordered in ED Medications  sodium chloride 0.9 %  bolus 1,000 mL (1,000 mLs Intravenous New Bag/Given 07/02/16 1007)    And  0.9 %  sodium chloride infusion (not administered)  lisinopril (PRINIVIL,ZESTRIL) tablet 20 mg (not administered)  hydrochlorothiazide (MICROZIDE) capsule 12.5 mg (not administered)  metFORMIN (GLUCOPHAGE) tablet 500 mg (not administered)  insulin aspart (novoLOG) injection 10 Units (10 Units Intravenous Given 07/02/16 1007)     Initial Impression / Assessment and Plan / ED Course  I have reviewed the triage vital signs and the nursing notes.  Pertinent labs & imaging results that were available during my care of the patient were reviewed by me and considered in my medical decision making (see chart for details).  Clinical Course     Pt is feeling better after IVFs and better blood sugar control.  No signs of DKA.  Pt will be given a rx for lisinopril and metformin.  He is instructed to take his meds as directed and to return if worse.  Final Clinical Impressions(s) / ED Diagnoses   Final diagnoses:  Metabolic encephalopathy  Poorly controlled type 2 diabetes mellitus [facility])  Essential hypertension    New Prescriptions Current Discharge Medication List       Jacalyn Lefevre, MD 07/02/16 [phone]

## 2016-07-02 NOTE — ED Triage Notes (Signed)
Pt brought in by EMS due to pt being confused. Pt comes from homeless shelter. Per EMS homeless [facility]) says he has been this way since Monday. Pt is a&ox3. Pt is hypertensive on presentation.

## 2016-07-02 NOTE — ED Notes (Signed)
SW will get pt a cab voucher back to [facility].

## 2016-07-02 NOTE — ED Notes (Signed)
Pt given cab voucher and cab is in the way to pick pt up from waiting room.

## 2016-07-11 ENCOUNTER — Emergency Department (HOSPITAL_COMMUNITY)
Admission: EM | Admit: 2016-07-11 | Discharge: 2016-07-11 | Disposition: A | Discharge status: Home / Self Care | Payer: No Typology Code available for payment source | Attending: Emergency Medicine | Admitting: Emergency Medicine

## 2016-07-11 ENCOUNTER — Encounter (HOSPITAL_COMMUNITY): Payer: Self-pay

## 2016-07-11 ENCOUNTER — Emergency Department (HOSPITAL_COMMUNITY): Payer: No Typology Code available for payment source

## 2016-07-11 DIAGNOSIS — Z91148 Patient's other noncompliance with medication regimen for other reason: Secondary | ICD-10-CM

## 2016-07-11 DIAGNOSIS — F329 Major depressive disorder, single episode, unspecified: Secondary | ICD-10-CM | POA: Insufficient documentation

## 2016-07-11 DIAGNOSIS — E11319 Type 2 diabetes mellitus with unspecified diabetic retinopathy without macular edema: Secondary | ICD-10-CM | POA: Insufficient documentation

## 2016-07-11 DIAGNOSIS — Z8673 Personal history of transient ischemic attack (TIA), and cerebral infarction without residual deficits: Secondary | ICD-10-CM | POA: Insufficient documentation

## 2016-07-11 DIAGNOSIS — I10 Essential (primary) hypertension: Secondary | ICD-10-CM

## 2016-07-11 DIAGNOSIS — Z9114 Patient's other noncompliance with medication regimen: Secondary | ICD-10-CM | POA: Insufficient documentation

## 2016-07-11 DIAGNOSIS — E1165 Type 2 diabetes mellitus with hyperglycemia: Secondary | ICD-10-CM | POA: Insufficient documentation

## 2016-07-11 DIAGNOSIS — Z79899 Other long term (current) drug therapy: Secondary | ICD-10-CM | POA: Insufficient documentation

## 2016-07-11 DIAGNOSIS — R739 Hyperglycemia, unspecified: Secondary | ICD-10-CM

## 2016-07-11 DIAGNOSIS — F32A Depression, unspecified: Secondary | ICD-10-CM

## 2016-07-11 DIAGNOSIS — Z7982 Long term (current) use of aspirin: Secondary | ICD-10-CM | POA: Insufficient documentation

## 2016-07-11 DIAGNOSIS — Z7984 Long term (current) use of oral hypoglycemic drugs: Secondary | ICD-10-CM | POA: Insufficient documentation

## 2016-07-11 LAB — CBC WITH DIFFERENTIAL/PLATELET
BASOS ABS: 0 10*3/uL (ref 0.0–0.1)
Basophils Relative: 0 %
EOS ABS: 0 10*3/uL (ref 0.0–0.7)
Eosinophils Relative: 1 %
HCT: 39.1 % (ref 39.0–52.0)
HEMOGLOBIN: 12.6 g/dL — AB (ref 13.0–17.0)
LYMPHS ABS: 1.3 10*3/uL (ref 0.7–4.0)
Lymphocytes Relative: 21 %
MCH: 26.6 pg (ref 26.0–34.0)
MCHC: 32.2 g/dL (ref 30.0–36.0)
MCV: 82.5 fL (ref 78.0–100.0)
Monocytes Absolute: 0.7 10*3/uL (ref 0.1–1.0)
Monocytes Relative: 12 %
NEUTROS PCT: 66 %
Neutro Abs: 4 10*3/uL (ref 1.7–7.7)
Platelets: 193 10*3/uL (ref 150–400)
RBC: 4.74 MIL/uL (ref 4.22–5.81)
RDW: 12.5 % (ref 11.5–15.5)
WBC: 6.1 10*3/uL (ref 4.0–10.5)

## 2016-07-11 LAB — RAPID URINE DRUG SCREEN, HOSP PERFORMED
AMPHETAMINES: NOT DETECTED
BENZODIAZEPINES: NOT DETECTED
Barbiturates: NOT DETECTED
COCAINE: NOT DETECTED
OPIATES: NOT DETECTED
TETRAHYDROCANNABINOL: NOT DETECTED

## 2016-07-11 LAB — COMPREHENSIVE METABOLIC PANEL
ALT: 12 U/L — AB (ref 17–63)
ANION GAP: 10 (ref 5–15)
AST: 17 U/L (ref 15–41)
Albumin: 2.9 g/dL — ABNORMAL LOW (ref 3.5–5.0)
Alkaline Phosphatase: 52 U/L (ref 38–126)
BUN: 13 mg/dL (ref 6–20)
CALCIUM: 9.2 mg/dL (ref 8.9–10.3)
CHLORIDE: 100 mmol/L — AB (ref 101–111)
CO2: 25 mmol/L (ref 22–32)
CREATININE: 1.48 mg/dL — AB (ref 0.61–1.24)
GFR, EST NON AFRICAN AMERICAN: 53 mL/min — AB (ref >60)
Glucose, Bld: 307 mg/dL — ABNORMAL HIGH (ref 65–99)
Potassium: 4.1 mmol/L (ref 3.5–5.1)
Sodium: 135 mmol/L (ref 135–145)
Total Bilirubin: 0.3 mg/dL (ref 0.3–1.2)
Total Protein: 5.9 g/dL — ABNORMAL LOW (ref 6.5–8.1)

## 2016-07-11 LAB — ETHANOL

## 2016-07-11 MED ORDER — LISINOPRIL 20 MG PO TABS
20.0000 mg | ORAL_TABLET | Freq: Once | ORAL | Status: AC
  Administered 2016-07-11: 20 mg via ORAL
  Filled 2016-07-11: qty 1

## 2016-07-11 MED ORDER — METFORMIN HCL 500 MG PO TABS: 500.0000 mg | ORAL_TABLET | Freq: Two times a day (BID) | ORAL | 2 refills | Status: DC

## 2016-07-11 MED ORDER — METFORMIN HCL 500 MG PO TABS
1000.0000 mg | ORAL_TABLET | Freq: Once | ORAL | Status: AC
  Administered 2016-07-11: 1000 mg via ORAL
  Filled 2016-07-11: qty 2

## 2016-07-11 MED ORDER — LISINOPRIL 20 MG PO TABS: 20.0000 mg | ORAL_TABLET | Freq: Every day | ORAL | 0 refills | Status: DC

## 2016-07-11 NOTE — Discharge Instructions (Signed)
Go to clinic tomorrow to get prescriptions filled.  Follow up with your doctor this week to recheck bp, blood sugar, and have your depression further evaluated.

## 2016-07-11 NOTE — ED Triage Notes (Signed)
Pt was dropped off from the shelter to [facility] because he had been there before for mental issues pt was brought here. Stroke scale as per EMS was negative no urinary symptoms no recent surgery not febrile pupils equal and reactive

## 2016-07-11 NOTE — ED Provider Notes (Signed)
[facility] DEPT Provider Note   CSN: [id number] Arrival date & time: 07/11/16  1725     History   Chief Complaint Chief Complaint  Patient presents with  . Altered Mental Status    pt sent in for eval for altered mental     HPI Vincent Damearon Ferrebee is a 51 y.o. male.  HPI  51 y.o male h.o. Dm , hypertension, h.o. cva presents stating he he is confused.  He has been living in a shelter- states he wonders why he is going through all this.  Doesn't drink, denies drugs.  States he is sad and crying but doesn't knwo why he is crying.  States history of depression.  Stroke with left side weakness but resolved- Primary md- [facility].Not taking medicine for two days because he left in his car.  Past Medical History:  Diagnosis Date  . Diabetes mellitus   . Hyperlipidemia   . Hypertension   . Whiplash    04/2012    Patient Active Problem List   Diagnosis Date Noted  . Diabetic retinopathy of both eyes (HCC) 09/17/2014  . Midline low back pain with right-sided sciatica 09/17/2014  . Hyperglycemia 05/27/2014  . Carbuncle of leg, right 03/26/2014  . Onychomycosis 03/26/2014  . 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 Essential hypertension 12/20/2013  . DM (diabetes mellitus) (HCC) 09/19/2013  . Left knee pain 09/19/2013  . Acute CVA (cerebrovascular accident) (HCC) 02/28/2013  . HTN (hypertension) with goal to be determined 02/28/2013  . Diabetes (HCC) 02/28/2013    Past Surgical History:  Procedure Laterality Date  . APPENDECTOMY         Home Medications    Prior to Admission medications   Medication Sig Start Date End Date Taking? Authorizing Provider  acetaminophen-codeine (TYLENOL #3) 300-30 MG per tablet Take 1 tablet by mouth every 4 (four) hours as needed. Patient not taking: Reported on 07/02/2016 09/17/14   Quentin Angstlugbemiga E Jegede, MD  aspirin 81 MG tablet Take 81 mg by mouth daily.    Historical Provider, MD  celecoxib (CELEBREX) 200 MG capsule Take  1 capsule (200 mg total) by mouth 2 (two) times daily. Patient not taking: Reported on 07/02/2016 08/20/14   Bethann BerkshireJoseph Zammit, MD  cyclobenzaprine (FLEXERIL) 10 MG tablet Take 1 tablet (10 mg total) by mouth 3 (three) times daily as needed for muscle spasms. Patient not taking: Reported on 07/02/2016 09/17/14   Quentin Angstlugbemiga E Jegede, MD  cyclobenzaprine (FLEXERIL) 10 MG tablet Take 1 tablet (10 mg total) by mouth 2 (two) times daily as needed for muscle spasms. Patient not taking: Reported on 07/02/2016 04/12/16   Danelle BerryLeisa Tapia, PA-C  Dapagliflozin Propanediol (FARXIGA) 10 MG TABS Take 10 mg by mouth daily. Patient not taking: Reported on 07/02/2016 03/26/14   Quentin Angstlugbemiga E Jegede, MD  HYDROcodone-acetaminophen (NORCO/VICODIN) 5-325 MG per tablet Take 1 tablet by mouth every 6 (six) hours as needed for moderate pain. Patient not taking: Reported on 09/24/2014 08/20/14   Bethann BerkshireJoseph Zammit, MD  lisinopril-hydrochlorothiazide (ZESTORETIC) 20-12.5 MG tablet Take 1 tablet by mouth daily. 07/02/16   Jacalyn LefevreJulie Haviland, MD  metFORMIN (GLUCOPHAGE) 500 MG tablet Take 1 tablet (500 mg total) by mouth 2 (two) times daily with a meal. 07/02/16   Jacalyn LefevreJulie Haviland, MD  simvastatin (ZOCOR) 20 MG tablet Take 1 tablet (20 mg total) by mouth at bedtime. Patient not taking: Reported on 07/02/2016 04/12/14   Quentin Angstlugbemiga E Jegede, MD  traMADol (ULTRAM) 50 MG tablet Take 1 tablet (50 mg total) by mouth  every 12 (twelve) hours as needed for severe pain. Patient not taking: Reported on 07/02/2016 04/12/16   Danelle Berry, PA-C    Family History Family History  Problem Relation Age of Onset  . Diabetes Mother   . Diabetes Brother   . Hypertension Maternal Uncle     Social History Social History  Substance Use Topics  . Smoking status: Never Smoker  . Smokeless tobacco: Never Used  . Alcohol use No     Allergies   Penicillins   Review of Systems Review of Systems  Constitutional: Positive for appetite change and chills. Negative for fatigue,  fever and unexpected weight change.  HENT: Positive for dental problem, sneezing and sore throat. Negative for congestion, drooling, ear discharge, ear pain, facial swelling, hearing loss, mouth sores, nosebleeds, postnasal drip, rhinorrhea, sinus pain, sinus pressure, tinnitus, trouble swallowing and voice change.   Respiratory: Negative.   Cardiovascular: Negative.   Gastrointestinal: Negative.        Hiccups  Endocrine: Negative.   Genitourinary: Negative.   Musculoskeletal: Negative.   Skin: Negative.   Neurological: Positive for headaches.  Hematological: Negative.   Psychiatric/Behavioral: Positive for confusion, decreased concentration and dysphoric mood. Negative for agitation, behavioral problems, hallucinations, self-injury, sleep disturbance and suicidal ideas. The patient is nervous/anxious. The patient is not hyperactive.   All other systems reviewed and are negative.    Physical Exam Updated Vital Signs BP (!) 190/109 (BP Location: Right Arm)   Pulse 91   Temp 99.8 F (37.7 C) (Oral)   Resp 16   Ht 6\' 2"  (1.88 m)   Wt 95.3 kg   SpO2 100%   BMI 26.96 kg/m   Physical Exam  Constitutional: He is oriented to person, place, and time. He appears well-developed and well-nourished.  HENT:  Head: Normocephalic and atraumatic.  Right Ear: External ear normal.  Left Ear: External ear normal.  Nose: Nose normal.  Mouth/Throat: Oropharynx is clear and moist.  Eyes: Conjunctivae and EOM are normal. Pupils are equal, round, and reactive to light.  Neck: Normal range of motion. Neck supple.  Cardiovascular: Normal rate, regular rhythm, normal heart sounds and intact distal pulses.   Pulmonary/Chest: Effort normal and breath sounds normal.  Abdominal: Soft. Bowel sounds are normal.  Musculoskeletal: Normal range of motion.  Neurological: He is alert and oriented to person, place, and time. He has normal reflexes. He displays normal reflexes. No cranial nerve deficit or  sensory deficit. He exhibits normal muscle tone. Coordination normal.  Skin: Skin is warm and dry.  Psychiatric: He has a normal mood and affect. His speech is normal and behavior is normal. Judgment and thought content normal. His mood appears not anxious. His affect is not blunt and not labile. Cognition and memory are normal. He does not exhibit a depressed mood.  Patient endorses sadness and crying but affect here is normal.   Endorses normal sleep and appetite  Nursing note and vitals reviewed.    ED Treatments / Results  Labs (all labs ordered are listed, but only abnormal results are displayed) Labs Reviewed  COMPREHENSIVE METABOLIC PANEL - Abnormal; Notable for the following:       Result Value   Chloride 100 (*)    Glucose, Bld 307 (*)    Creatinine, Ser 1.48 (*)    Total Protein 5.9 (*)    Albumin 2.9 (*)    ALT 12 (*)    GFR calc non Af Amer 53 (*)    All other components  within normal limits  CBC WITH DIFFERENTIAL/PLATELET - Abnormal; Notable for the following:    Hemoglobin 12.6 (*)    All other components within normal limits  ETHANOL  RAPID URINE DRUG SCREEN, HOSP PERFORMED    EKG  EKG Interpretation  Date/Time:  Sunday July 11 2016 18:45:03 EST Ventricular Rate:  95 PR Interval:    QRS Duration: 82 QT Interval:  374 QTC Calculation: 471 R Axis:   40 Text Interpretation:  Sinus rhythm Borderline T abnormalities, lateral leads No significant change since last tracing Confirmed by Loran Fleet MD, Duwayne Heck [phone]) on 07/11/2016 7:23:11 PM       Radiology Dg Chest 2 View  Result Date: 07/11/2016 CLINICAL DATA:  Altered mental status, confusion, hypertension, diabetes mellitus, history stroke EXAM: CHEST  2 VIEW COMPARISON:  None FINDINGS: Normal heart size, mediastinal contours, and pulmonary vascularity. Mild chronic bronchitic changes. Lungs otherwise clear. No pleural effusion or pneumothorax. No acute bony abnormalities. IMPRESSION: Minimal chronic bronchitic  changes without infiltrate. Electronically Signed   By: Ulyses Southward M.D.   On: 07/11/2016 18:08    Procedures Procedures (including critical care time)  Medications Ordered in ED Medications - No data to display   Initial Impression / Assessment and Plan / ED Course  I have reviewed the triage vital signs and the nursing notes.  Pertinent labs & imaging results that were available during my care of the patient were reviewed by me and considered in my medical decision making (see chart for details).  Clinical Course    Vitals:   07/11/16 2115 07/11/16 2130  BP: (!) 186/107 186/97  Pulse: 96 95  Resp: 16 19  Temp:      1- depression - no si,hi, or Psychosis 2 noncompliance with medications 3 diabetes with hyperglycemia no evidence of DKA secondary to #2 4 hypertension BP elevated secondary to #2-blood pressure coming down after being dosed medications here.  Plan refilling medications and will give referrals for outpatient follow-up for depression. Patient voices understanding with plan and return precautions Final Clinical Impressions(s) / ED Diagnoses   Final diagnoses:  Depression, unspecified depression type  H/O medication noncompliance  Hyperglycemia  Hypertension, unspecified type    New Prescriptions New Prescriptions   LISINOPRIL (PRINIVIL,ZESTRIL) 20 MG TABLET    Take 1 tablet (20 mg total) by mouth daily.   METFORMIN (GLUCOPHAGE) 500 MG TABLET    Take 1 tablet (500 mg total) by mouth 2 (two) times daily with a meal.     Margarita Grizzle, MD 07/11/16 2233

## 2016-07-22 ENCOUNTER — Emergency Department (HOSPITAL_COMMUNITY): Payer: No Typology Code available for payment source

## 2016-07-22 ENCOUNTER — Emergency Department (HOSPITAL_COMMUNITY)
Admission: EM | Admit: 2016-07-22 | Discharge: 2016-07-22 | Disposition: A | Discharge status: Home / Self Care | Payer: No Typology Code available for payment source | Attending: Emergency Medicine | Admitting: Emergency Medicine

## 2016-07-22 ENCOUNTER — Encounter (HOSPITAL_COMMUNITY): Payer: Self-pay

## 2016-07-22 DIAGNOSIS — R739 Hyperglycemia, unspecified: Secondary | ICD-10-CM

## 2016-07-22 DIAGNOSIS — M79604 Pain in right leg: Secondary | ICD-10-CM | POA: Insufficient documentation

## 2016-07-22 DIAGNOSIS — Z7984 Long term (current) use of oral hypoglycemic drugs: Secondary | ICD-10-CM | POA: Insufficient documentation

## 2016-07-22 DIAGNOSIS — Z7982 Long term (current) use of aspirin: Secondary | ICD-10-CM | POA: Insufficient documentation

## 2016-07-22 DIAGNOSIS — E1165 Type 2 diabetes mellitus with hyperglycemia: Secondary | ICD-10-CM | POA: Insufficient documentation

## 2016-07-22 DIAGNOSIS — Z79899 Other long term (current) drug therapy: Secondary | ICD-10-CM | POA: Insufficient documentation

## 2016-07-22 DIAGNOSIS — M79605 Pain in left leg: Secondary | ICD-10-CM | POA: Insufficient documentation

## 2016-07-22 DIAGNOSIS — I159 Secondary hypertension, unspecified: Secondary | ICD-10-CM

## 2016-07-22 DIAGNOSIS — R51 Headache: Secondary | ICD-10-CM | POA: Insufficient documentation

## 2016-07-22 DIAGNOSIS — E113293 Type 2 diabetes mellitus with mild nonproliferative diabetic retinopathy without macular edema, bilateral: Secondary | ICD-10-CM | POA: Insufficient documentation

## 2016-07-22 DIAGNOSIS — Z8673 Personal history of transient ischemic attack (TIA), and cerebral infarction without residual deficits: Secondary | ICD-10-CM | POA: Insufficient documentation

## 2016-07-22 LAB — COMPREHENSIVE METABOLIC PANEL
ALBUMIN: 2.7 g/dL — AB (ref 3.5–5.0)
ALK PHOS: 64 U/L (ref 38–126)
ALT: 16 U/L — AB (ref 17–63)
AST: 19 U/L (ref 15–41)
Anion gap: 7 (ref 5–15)
BUN: 12 mg/dL (ref 6–20)
CO2: 29 mmol/L (ref 22–32)
CREATININE: 1.48 mg/dL — AB (ref 0.61–1.24)
Calcium: 8.9 mg/dL (ref 8.9–10.3)
Chloride: 100 mmol/L — ABNORMAL LOW (ref 101–111)
GFR calc Af Amer: >60 mL/min (ref >60)
GFR calc non Af Amer: 53 mL/min — ABNORMAL LOW (ref >60)
GLUCOSE: 431 mg/dL — AB (ref 65–99)
Potassium: 4.1 mmol/L (ref 3.5–5.1)
SODIUM: 136 mmol/L (ref 135–145)
Total Bilirubin: 0.3 mg/dL (ref 0.3–1.2)
Total Protein: 6.1 g/dL — ABNORMAL LOW (ref 6.5–8.1)

## 2016-07-22 LAB — URINALYSIS, ROUTINE W REFLEX MICROSCOPIC
Bacteria, UA: NONE SEEN
Bilirubin Urine: NEGATIVE
Glucose, UA: >=500 mg/dL — AB
Ketones, ur: NEGATIVE mg/dL
Leukocytes, UA: NEGATIVE
Nitrite: NEGATIVE
PROTEIN: 100 mg/dL — AB
SQUAMOUS EPITHELIAL / LPF: NONE SEEN
Specific Gravity, Urine: 1.028 (ref 1.005–1.030)
pH: 6 (ref 5.0–8.0)

## 2016-07-22 LAB — CBC WITH DIFFERENTIAL/PLATELET
BASOS PCT: 0 %
Basophils Absolute: 0 10*3/uL (ref 0.0–0.1)
EOS ABS: 0.1 10*3/uL (ref 0.0–0.7)
Eosinophils Relative: 1 %
HCT: 38.2 % — ABNORMAL LOW (ref 39.0–52.0)
HEMOGLOBIN: 12.4 g/dL — AB (ref 13.0–17.0)
LYMPHS ABS: 2.4 10*3/uL (ref 0.7–4.0)
Lymphocytes Relative: 27 %
MCH: 26.4 pg (ref 26.0–34.0)
MCHC: 32.5 g/dL (ref 30.0–36.0)
MCV: 81.4 fL (ref 78.0–100.0)
Monocytes Absolute: 0.8 10*3/uL (ref 0.1–1.0)
Monocytes Relative: 10 %
NEUTROS PCT: 62 %
Neutro Abs: 5.4 10*3/uL (ref 1.7–7.7)
Platelets: 184 10*3/uL (ref 150–400)
RBC: 4.69 MIL/uL (ref 4.22–5.81)
RDW: 12.4 % (ref 11.5–15.5)
WBC: 8.8 10*3/uL (ref 4.0–10.5)

## 2016-07-22 LAB — I-STAT TROPONIN, ED: Troponin i, poc: 0.02 ng/mL (ref 0.00–0.08)

## 2016-07-22 LAB — CBG MONITORING, ED
GLUCOSE-CAPILLARY: 441 mg/dL — AB (ref 65–99)
GLUCOSE-CAPILLARY: 351 mg/dL — AB (ref 65–99)
GLUCOSE-CAPILLARY: 272 mg/dL — AB (ref 65–99)

## 2016-07-22 MED ORDER — HYDROCHLOROTHIAZIDE 25 MG PO TABS
25.0000 mg | ORAL_TABLET | Freq: Once | ORAL | Status: AC
  Administered 2016-07-22: 25 mg via ORAL
  Filled 2016-07-22: qty 1

## 2016-07-22 MED ORDER — LISINOPRIL-HYDROCHLOROTHIAZIDE 20-12.5 MG PO TABS: 1.0000 | ORAL_TABLET | Freq: Every day | ORAL | 1 refills | Status: DC

## 2016-07-22 MED ORDER — INSULIN ASPART 100 UNIT/ML ~~LOC~~ SOLN
8.0000 [IU] | Freq: Once | SUBCUTANEOUS | Status: AC
  Administered 2016-07-22: 8 [IU] via SUBCUTANEOUS
  Filled 2016-07-22: qty 1

## 2016-07-22 MED ORDER — INSULIN ASPART 100 UNIT/ML ~~LOC~~ SOLN: 10.0000 [IU] | Freq: Once | SUBCUTANEOUS | Status: DC

## 2016-07-22 MED ORDER — LISINOPRIL 20 MG PO TABS
20.0000 mg | ORAL_TABLET | Freq: Once | ORAL | Status: AC
  Administered 2016-07-22: 20 mg via ORAL
  Filled 2016-07-22: qty 1

## 2016-07-22 MED ORDER — INSULIN ASPART 100 UNIT/ML ~~LOC~~ SOLN
SUBCUTANEOUS | Status: AC
  Filled 2016-07-22: qty 1

## 2016-07-22 MED ORDER — ACETAMINOPHEN 325 MG PO TABS
650.0000 mg | ORAL_TABLET | Freq: Once | ORAL | Status: AC
  Administered 2016-07-22: 650 mg via ORAL
  Filled 2016-07-22: qty 2

## 2016-07-22 MED ORDER — SODIUM CHLORIDE 0.9 % IV BOLUS (SEPSIS)
1000.0000 mL | Freq: Once | INTRAVENOUS | Status: AC
  Administered 2016-07-22: 1000 mL via INTRAVENOUS

## 2016-07-22 MED ORDER — METFORMIN HCL 500 MG PO TABS: 500.0000 mg | ORAL_TABLET | Freq: Two times a day (BID) | ORAL | 1 refills | Status: DC

## 2016-07-22 MED ORDER — METFORMIN HCL 500 MG PO TABS
500.0000 mg | ORAL_TABLET | Freq: Once | ORAL | Status: AC
  Administered 2016-07-22: 500 mg via ORAL
  Filled 2016-07-22: qty 1

## 2016-07-22 NOTE — Care Management (Signed)
ED CM met with patient at bedside.Patient reports being  homeless and is active with [facility] [place] and is followed by Reginal Lutes NP at the [facility]. Patient was evaluated in the ED no acute findings. Patient instructed to follow up with Reginal Lutes NP at the [facility] for assistance with prescriptions and follow up in the am. Patient verbalized understanding teach back done. Discharge summary faxed to Reginal Lutes NP  at the [facility]. No further ED CM needs identified.

## 2016-07-22 NOTE — ED Notes (Signed)
POC CBG 272. RN notified

## 2016-07-22 NOTE — ED Notes (Signed)
cbg was 441

## 2016-07-22 NOTE — ED Provider Notes (Signed)
[facility] Provider Note   CSN: [id number] Arrival date & time: 07/22/16  1702     History   Chief Complaint Chief Complaint  Patient presents with  . Headache  . Hypertension    HPI Vincent Osborne is a 51 y.o. male.  HPI Vincent Damearon Vallez is a 51 y.o. male with history of hyperlipidemia, hypertension, diabetes, CVA, homelessness presents to emergency department with generalized malaise, headache, weakness, pain to her legs. Patient states that his symptoms have been going on for about a month. Today he states he was sleeping in his car, felt slightly disoriented, and his friend called EMS. Patient states he has not had his medications in over a month. He [facility], but states he hasn't gotten them, and unable to give me the reason why. He states headache is generalized, no associated dizziness, nausea, vomiting. No numbness or weakness in his arms, reports some tingling in bilateral legs along with pain. He denies any chest pain or shortness of breath. No abdominal pain. No head injuries. No syncopal episodes. No other complaints.  Past Medical History:  Diagnosis Date  . Diabetes mellitus   . Hyperlipidemia   . Hypertension   . Whiplash    04/2012    Patient Active Problem List   Diagnosis Date Noted  . Diabetic retinopathy of both eyes (HCC) 09/17/2014  . Midline low back pain with right-sided sciatica 09/17/2014  . Hyperglycemia 05/27/2014  . Carbuncle of leg, right 03/26/2014  . Onychomycosis 03/26/2014  . 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 Essential hypertension 12/20/2013  . DM (diabetes mellitus) (HCC) 09/19/2013  . Left knee pain 09/19/2013  . Acute CVA (cerebrovascular accident) (HCC) 02/28/2013  . HTN (hypertension) with goal to be determined 02/28/2013  . Diabetes (HCC) 02/28/2013    Past Surgical History:  Procedure Laterality Date  . APPENDECTOMY         Home Medications    Prior to  Admission medications   Medication Sig Start Date End Date Taking? Authorizing Provider  acetaminophen-codeine (TYLENOL #3) 300-30 MG per tablet Take 1 tablet by mouth every 4 (four) hours as needed. Patient not taking: Reported on 07/02/2016 09/17/14   Quentin Angstlugbemiga E Jegede, MD  aspirin 81 MG tablet Take 81 mg by mouth daily.    Historical Provider, MD  celecoxib (CELEBREX) 200 MG capsule Take 1 capsule (200 mg total) by mouth 2 (two) times daily. Patient not taking: Reported on 07/02/2016 08/20/14   Bethann BerkshireJoseph Zammit, MD  cyclobenzaprine (FLEXERIL) 10 MG tablet Take 1 tablet (10 mg total) by mouth 3 (three) times daily as needed for muscle spasms. Patient not taking: Reported on 07/02/2016 09/17/14   Quentin Angstlugbemiga E Jegede, MD  cyclobenzaprine (FLEXERIL) 10 MG tablet Take 1 tablet (10 mg total) by mouth 2 (two) times daily as needed for muscle spasms. Patient not taking: Reported on 07/02/2016 04/12/16   Danelle BerryLeisa Tapia, PA-C  Dapagliflozin Propanediol (FARXIGA) 10 MG TABS Take 10 mg by mouth daily. Patient not taking: Reported on 07/02/2016 03/26/14   Quentin Angstlugbemiga E Jegede, MD  HYDROcodone-acetaminophen (NORCO/VICODIN) 5-325 MG per tablet Take 1 tablet by mouth every 6 (six) hours as needed for moderate pain. Patient not taking: Reported on 09/24/2014 08/20/14   Bethann BerkshireJoseph Zammit, MD  lisinopril (PRINIVIL,ZESTRIL) 20 MG tablet Take 1 tablet (20 mg total) by mouth daily. 07/11/16   Margarita Grizzleanielle Ray, MD  metFORMIN (GLUCOPHAGE) 500 MG tablet Take 1 tablet (500 mg total) by mouth 2 (two) times daily with a  meal. 07/11/16   Margarita Grizzle, MD  simvastatin (ZOCOR) 20 MG tablet Take 1 tablet (20 mg total) by mouth at bedtime. Patient not taking: Reported on 07/02/2016 04/12/14   Quentin Angst, MD  traMADol (ULTRAM) 50 MG tablet Take 1 tablet (50 mg total) by mouth every 12 (twelve) hours as needed for severe pain. Patient not taking: Reported on 07/02/2016 04/12/16   Danelle Berry, PA-C    Family History Family History  Problem Relation  Age of Onset  . Diabetes Mother   . Diabetes Brother   . Hypertension Maternal Uncle     Social History Social History  Substance Use Topics  . Smoking status: Never Smoker  . Smokeless tobacco: Never Used  . Alcohol use Yes     Allergies   Penicillins   Review of Systems Review of Systems  Constitutional: Positive for fatigue. Negative for chills and fever.  Respiratory: Negative for cough, chest tightness and shortness of breath.   Cardiovascular: Negative for chest pain, palpitations and leg swelling.  Gastrointestinal: Negative for abdominal distention, abdominal pain, diarrhea, nausea and vomiting.  Genitourinary: Negative for dysuria, frequency, hematuria and urgency.  Musculoskeletal: Positive for arthralgias. Negative for myalgias, neck pain and neck stiffness.  Skin: Negative for rash.  Allergic/Immunologic: Negative for immunocompromised state.  Neurological: Positive for weakness and headaches. Negative for dizziness, light-headedness and numbness.  All other systems reviewed and are negative.    Physical Exam Updated Vital Signs BP (!) 188/110 (BP Location: Right Arm)   Pulse 96   Temp 98.6 F (37 C) (Oral)   Resp 17   SpO2 100%   Physical Exam  Constitutional: He is oriented to person, place, and time. He appears well-developed and well-nourished. No distress.  HENT:  Head: Normocephalic and atraumatic.  Eyes: Conjunctivae and EOM are normal. Pupils are equal, round, and reactive to light.  Neck: Neck supple.  Cardiovascular: Normal rate, regular rhythm and normal heart sounds.   Pulmonary/Chest: Effort normal. No respiratory distress. He has no wheezes. He has no rales.  Abdominal: Soft. Bowel sounds are normal. He exhibits no distension. There is no tenderness. There is no rebound.  Musculoskeletal: He exhibits no edema.  Neurological: He is alert and oriented to person, place, and time.  5/5 and equal upper and lower extremity strength  bilaterally. Equal grip strength bilaterally. Normal finger to nose and heel to shin. No pronator drift. Patellar reflexes 2+   Skin: Skin is warm and dry.  Nursing note and vitals reviewed.    ED Treatments / Results  Labs (all labs ordered are listed, but only abnormal results are displayed) Labs Reviewed  CBC WITH DIFFERENTIAL/PLATELET - Abnormal; Notable for the following:       Result Value   Hemoglobin 12.4 (*)    HCT 38.2 (*)    All other components within normal limits  COMPREHENSIVE METABOLIC PANEL - Abnormal; Notable for the following:    Chloride 100 (*)    Glucose, Bld 431 (*)    Creatinine, Ser 1.48 (*)    Total Protein 6.1 (*)    Albumin 2.7 (*)    ALT 16 (*)    GFR calc non Af Amer 53 (*)    All other components within normal limits  URINALYSIS, ROUTINE W REFLEX MICROSCOPIC - Abnormal; Notable for the following:    Color, Urine STRAW (*)    Glucose, UA >=500 (*)    Hgb urine dipstick MODERATE (*)    Protein, ur 100 (*)  All other components within normal limits  CBG MONITORING, ED - Abnormal; Notable for the following:    Glucose-Capillary 441 (*)    All other components within normal limits  CBG MONITORING, ED - Abnormal; Notable for the following:    Glucose-Capillary 351 (*)    All other components within normal limits  CBG MONITORING, ED - Abnormal; Notable for the following:    Glucose-Capillary 272 (*)    All other components within normal limits  I-STAT TROPOININ, ED    EKG  EKG Interpretation None       Radiology Ct Head Wo Contrast  Result Date: 07/22/2016 CLINICAL DATA:  Headache.  Hypertension.  History of diabetes. EXAM: CT HEAD WITHOUT CONTRAST TECHNIQUE: Contiguous axial images were obtained from the base of the skull through the vertex without intravenous contrast. COMPARISON:  07/02/2016.  MRI 02/28/2013. FINDINGS: Brain: Normal appearance by CT without evidence of atrophy, old or acute infarction, mass lesion, hemorrhage,  hydrocephalus or extra-axial collection. Infarction of the right lateral thalamus/posterior limb internal capsule in 2014 is not visible on this CT. Vascular: No abnormal vascular finding. Skull: Normal Sinuses/Orbits: Clear/normal Other: None significant IMPRESSION: No acute finding by CT. The patient had an old infarction in the right lateral thalamus/ posterior limb internal capsule in 2014, but that is not visible on this CT. Electronically Signed   By: Paulina Fusi M.D.   On: 07/22/2016 18:33    Procedures Procedures (including critical care time)  Medications Ordered in ED Medications  metFORMIN (GLUCOPHAGE) tablet 500 mg (500 mg Oral Given 07/22/16 1814)  lisinopril (PRINIVIL,ZESTRIL) tablet 20 mg (20 mg Oral Given 07/22/16 1814)  hydrochlorothiazide (HYDRODIURIL) tablet 25 mg (25 mg Oral Given 07/22/16 1814)  sodium chloride 0.9 % bolus 1,000 mL (0 mLs Intravenous Stopped 07/22/16 2023)  insulin aspart (novoLOG) injection 8 Units (8 Units Subcutaneous Given 07/22/16 2018)  acetaminophen (TYLENOL) tablet 650 mg (650 mg Oral Given 07/22/16 2109)     Initial Impression / Assessment and Plan / ED Course  I have reviewed the triage vital signs and the nursing notes.  Pertinent labs & imaging results that were available during my care of the patient were reviewed by me and considered in my medical decision making (see chart for details).   patient in emergency department with headache, bilateral leg pain, elevated blood pressure and the blood sugar, patient's currently homeless and unable to refill and take his medications. Vital signs show heart rate in the 90s, blood pressure 191/106. His neurological exam is unremarkable. His cockup pulses and cap refill to bilateral legs. No evidence of any joint or skin infection, no lesions, feet are warm and pink. Will check labs, CT head, urinalysis, will monitor. Will also give case manager consult regarding patient's situation.   Patient's lab work  unremarkable other than elevated blood sugar. This was treated with IV fluids and subcutaneous insulin. Sugar is down to 272. He does not have any evidence of decay. Blood pressure improved with regular medications given in ED. Upon discharge his blood pressure is 147/84. Patient was also seen by case Production designer, theatre/television/film and Child psychotherapist. They will try to get him set up with a shelter and assist with medications. I will give him a prescription at least right now for his blood pressure medicines and for his metformin. He is instructed to follow-up with his doctor. At this time he does not meet any criteria for admission. He was ambulatory in the hall with no difficulties. Vascularly intact.  Vitals:  07/22/16 2024 07/22/16 2030 07/22/16 2100 07/22/16 2101  BP:  158/95 147/84   Pulse: 101 102  98  Resp:      Temp:      TempSrc:      SpO2: 100% 100%  100%       Final Clinical Impressions(s) / ED Diagnoses   Final diagnoses:  Secondary hypertension  Hyperglycemia  Leg pain, bilateral    New Prescriptions Discharge Medication List as of 07/22/2016 11:17 PM    START taking these medications   Details  lisinopril-hydrochlorothiazide (ZESTORETIC) 20-12.5 MG tablet Take 1 tablet by mouth daily., Starting Thu 07/22/2016, Print    !! metFORMIN (GLUCOPHAGE) 500 MG tablet Take 1 tablet (500 mg total) by mouth 2 (two) times daily with a meal., Starting Thu 07/22/2016, Print     !! - Potential duplicate medications found. Please discuss with provider.       Jaynie Crumble, PA-C 07/23/16 0020    Linwood Dibbles, MD 07/23/16 [phone]

## 2016-07-22 NOTE — Progress Notes (Signed)
CSW met with pt to discuss his d/c plan.  Pt plans on returning to his car at d/c because "that's where he lives."  Pt has used the [place] shelters when the temp has dropped below freezing.  Pt is familiar with the [facility] as that is where he parks his car.  Pt familiar with area homeless shelters.  He is unable to recall exactly how long he has been living in his car.  Pt reports no income and is unsure whether/not he has applied for disability.  Emotional support provided.

## 2016-07-22 NOTE — ED Notes (Signed)
Ambulated pt to the bathroom, pt complained of his feet being numb and hurting really bad. Pt could not walk back to his room, I used a wheelchair to get him back. Pt was crying from his feet pain.

## 2016-07-22 NOTE — ED Triage Notes (Addendum)
Pt. Out of HTN meds for a few months and CO of a headache, and foot and leg pain that has been going for a few days now. Sts he feels a little altered mentally. CBG 500 with EMS

## 2016-07-22 NOTE — Discharge Instructions (Signed)
Continue your medications. Please follow-up with primary care doctor.

## 2016-08-02 NOTE — Congregational Nurse Program (Signed)
Congregational Nurse Program Note  Date of Encounter: 07/19/2016  Past Medical History: Past Medical History:  Diagnosis Date  . Diabetes mellitus   . Hyperlipidemia   . Hypertension   . Whiplash    04/2012    Encounter Details:     CNP Questionnaire - 07/19/16 1218      Patient Demographics   Is this a new or existing patient? Existing   Patient is considered a/an Not Applicable   Race African-American/Black     Patient Assistance   Location of Patient Assistance Not Applicable   Patient's financial/insurance status Low Income;Self-Pay (Uninsured)   Uninsured Patient ([employer] officer, trade unionCard/Care Connects) Yes   Interventions Not Applicable   Patient referred to apply for the following financial assistance [employer]/Care Connects   Food insecurities addressed Not Technical brewerApplicable   Transportation assistance No   Assistance securing medications No   Sales promotion account executiveducational health offerings Behavioral health     Encounter Details   Primary purpose of visit Navigating the Healthcare System;Spiritual Care/Support Visit   Was an Emergency Department visit averted? Not Applicable   Does patient have a medical provider? No   Patient referred to Clinic   Was a mental health screening completed? (GAINS tool) No   Does patient have dental issues? No   Does patient have vision issues? No   Does your patient have an abnormal blood pressure today? No   Since previous encounter, have you referred patient for abnormal blood pressure that resulted in a new diagnosis or medication change? No   Does your patient have an abnormal blood glucose today? No   Since previous encounter, have you referred patient for abnormal blood glucose that resulted in a new diagnosis or medication change? No   Was there a life-saving intervention made? No     [employer] Worker brought client in.  Affect bland, speech slowed.  States is "depressed".  Has not been taking medications.  Has not seen Lavinia SharpsMary Ann Placey his provider "in  awhile".  Referred back to Ms. Placey to be seen today.

## 2016-09-13 ENCOUNTER — Emergency Department (HOSPITAL_COMMUNITY)
Admission: EM | Admit: 2016-09-13 | Discharge: 2016-09-13 | Disposition: A | Discharge status: Home / Self Care | Payer: No Typology Code available for payment source | Attending: Emergency Medicine | Admitting: Emergency Medicine

## 2016-09-13 DIAGNOSIS — R739 Hyperglycemia, unspecified: Secondary | ICD-10-CM

## 2016-09-13 DIAGNOSIS — Z8673 Personal history of transient ischemic attack (TIA), and cerebral infarction without residual deficits: Secondary | ICD-10-CM | POA: Insufficient documentation

## 2016-09-13 DIAGNOSIS — I10 Essential (primary) hypertension: Secondary | ICD-10-CM | POA: Insufficient documentation

## 2016-09-13 DIAGNOSIS — E11319 Type 2 diabetes mellitus with unspecified diabetic retinopathy without macular edema: Secondary | ICD-10-CM | POA: Insufficient documentation

## 2016-09-13 DIAGNOSIS — Z7982 Long term (current) use of aspirin: Secondary | ICD-10-CM | POA: Insufficient documentation

## 2016-09-13 DIAGNOSIS — E1165 Type 2 diabetes mellitus with hyperglycemia: Secondary | ICD-10-CM | POA: Insufficient documentation

## 2016-09-13 DIAGNOSIS — Z7984 Long term (current) use of oral hypoglycemic drugs: Secondary | ICD-10-CM | POA: Insufficient documentation

## 2016-09-13 LAB — COMPREHENSIVE METABOLIC PANEL
ALK PHOS: 52 U/L (ref 38–126)
ALT: 10 U/L — AB (ref 17–63)
AST: 15 U/L (ref 15–41)
Albumin: 2.9 g/dL — ABNORMAL LOW (ref 3.5–5.0)
Anion gap: 8 (ref 5–15)
BUN: 11 mg/dL (ref 6–20)
CALCIUM: 9.3 mg/dL (ref 8.9–10.3)
CO2: 28 mmol/L (ref 22–32)
CREATININE: 1.53 mg/dL — AB (ref 0.61–1.24)
Chloride: 100 mmol/L — ABNORMAL LOW (ref 101–111)
GFR calc non Af Amer: 51 mL/min — ABNORMAL LOW (ref >60)
GFR, EST AFRICAN AMERICAN: 59 mL/min — AB (ref >60)
Glucose, Bld: 305 mg/dL — ABNORMAL HIGH (ref 65–99)
Potassium: 4.1 mmol/L (ref 3.5–5.1)
SODIUM: 136 mmol/L (ref 135–145)
Total Bilirubin: 0.7 mg/dL (ref 0.3–1.2)
Total Protein: 5.9 g/dL — ABNORMAL LOW (ref 6.5–8.1)

## 2016-09-13 LAB — CBC
HCT: 38.1 % — ABNORMAL LOW (ref 39.0–52.0)
HEMOGLOBIN: 12.4 g/dL — AB (ref 13.0–17.0)
MCH: 26.6 pg (ref 26.0–34.0)
MCHC: 32.5 g/dL (ref 30.0–36.0)
MCV: 81.6 fL (ref 78.0–100.0)
Platelets: 200 10*3/uL (ref 150–400)
RBC: 4.67 MIL/uL (ref 4.22–5.81)
RDW: 12.9 % (ref 11.5–15.5)
WBC: 6.2 10*3/uL (ref 4.0–10.5)

## 2016-09-13 MED ORDER — METFORMIN HCL 500 MG PO TABS
500.0000 mg | ORAL_TABLET | Freq: Once | ORAL | Status: AC
  Administered 2016-09-13: 500 mg via ORAL
  Filled 2016-09-13: qty 1

## 2016-09-13 MED ORDER — LISINOPRIL 20 MG PO TABS
40.0000 mg | ORAL_TABLET | Freq: Once | ORAL | Status: AC
  Administered 2016-09-13: 40 mg via ORAL
  Filled 2016-09-13: qty 2

## 2016-09-13 MED ORDER — AMLODIPINE BESYLATE 5 MG PO TABS
10.0000 mg | ORAL_TABLET | Freq: Once | ORAL | Status: AC
  Administered 2016-09-13: 10 mg via ORAL
  Filled 2016-09-13: qty 2

## 2016-09-13 NOTE — ED Provider Notes (Signed)
[facility]-EMERGENCY DEPT Provider Note   CSN: [id number] Arrival date & time: 09/13/16  1218     History   Chief Complaint Chief Complaint  Patient presents with  . Hypertension    HPI Vincent Osborne is a 51 y.o. male.  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 Patient is accompanied by a friend who he has been staying with over the last couple of months. Patient's friend supplements the history. She states that she was [facility]. During the course of check in, his blood pressure was elevated to 190 systolic and the patient became dizzy. They requested that he come here for further evaluation. She states that the patient has some short-term memory deficits, but seems to be at his cognitive baseline, and has been feeling well over the last few days.  Patient tells me that he feels well today. Denies headache, vision changes, chest pain, shortness of breath, or abdominal pain. He does remember feeling dizzy while at the veterans home. States he felt as if he was "off balance" for a couple of seconds, which resolved when he sat down. He has had similar symptoms before but they were transient. He states that he has not taken his medication for several days, though he is unsure exactly how many days it has been. States he does not check his blood sugar at home. Denies recent illness. States that he is currently asymptomatic, and is excited to move into the veterans home.  Past Medical History:  Diagnosis Date  . Diabetes mellitus   . Hyperlipidemia   . Hypertension   . Whiplash    04/2012    Patient Active Problem List   Diagnosis Date Noted  . Diabetic retinopathy of both eyes (HCC) 09/17/2014  . Midline low back pain with right-sided sciatica 09/17/2014  . Hyperglycemia 05/27/2014  . Carbuncle of leg, right  03/26/2014  . Onychomycosis 03/26/2014  . 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 Essential hypertension 12/20/2013  . DM (diabetes mellitus) (HCC) 09/19/2013  . Left knee pain 09/19/2013  . Acute CVA (cerebrovascular accident) (HCC) 02/28/2013  . HTN (hypertension) with goal to be determined 02/28/2013  . Diabetes (HCC) 02/28/2013    Past Surgical History:  Procedure Laterality Date  . APPENDECTOMY         Home Medications    Prior to Admission medications   Medication Sig Start Date End Date Taking? Authorizing Provider  aspirin 81 MG tablet Take 81 mg by mouth daily.    Historical Provider, MD  lisinopril (PRINIVIL,ZESTRIL) 20 MG tablet Take 1 tablet (20 mg total) by mouth daily. Patient not taking: Reported on 07/22/2016 07/11/16   Margarita Grizzle, MD  lisinopril-hydrochlorothiazide (ZESTORETIC) 20-12.5 MG tablet Take 1 tablet by mouth daily. 07/22/16   Tatyana Kirichenko, PA-C  metFORMIN (GLUCOPHAGE) 500 MG tablet Take 1 tablet (500 mg total) by mouth 2 (two) times daily with a meal. Patient not taking: Reported on 07/22/2016 07/11/16   Margarita Grizzle, MD  metFORMIN (GLUCOPHAGE) 500 MG tablet Take 1 tablet (500 mg total) by mouth 2 (two) times daily with a meal. 07/22/16   Jaynie Crumble, PA-C    Family History Family History  Problem Relation Age of Onset  . Diabetes Mother   . Diabetes Brother   . Hypertension Maternal Uncle     Social History Social History  Substance Use Topics  . Smoking status: Never Smoker  . Smokeless tobacco: Never Used  . Alcohol use Yes     Allergies   Penicillins   Review of Systems Review of Systems  Constitutional: Negative for chills and fever.  HENT: Negative for congestion, rhinorrhea and sore throat.   Eyes: Negative for visual disturbance.  Respiratory: Negative for cough, shortness of breath and wheezing.   Cardiovascular: Negative for chest pain, palpitations and leg swelling.  Gastrointestinal:  Negative for abdominal pain, constipation, diarrhea, nausea and vomiting.  Genitourinary: Negative for difficulty urinating, dysuria, flank pain and frequency.  Musculoskeletal: Negative for arthralgias, back pain, joint swelling, myalgias, neck pain and neck stiffness.  Skin: Negative for rash.  Neurological: Positive for dizziness. Negative for syncope, weakness, light-headedness, numbness and headaches.  Psychiatric/Behavioral: Negative for agitation, behavioral problems and confusion.     Physical Exam Updated Vital Signs BP (!) 154/106   Pulse 93   Temp 98.6 F (37 C) (Oral)   Resp 16   Ht 6\' 1"  (1.854 m)   Wt 86.2 kg   SpO2 100%   BMI 25.07 kg/m   Physical Exam  Constitutional: He is oriented to person, place, and time. He appears well-developed and well-nourished.  HENT:  Head: Normocephalic and atraumatic.  Right Ear: External ear normal.  Left Ear: External ear normal.  Nose: Nose normal.  Mouth/Throat: Oropharynx is clear and moist.  Eyes: Conjunctivae and EOM are normal. Pupils are equal, round, and reactive to light.  Neck: Normal range of motion. Neck supple.  Cardiovascular: Normal rate, regular rhythm, normal heart sounds and intact distal pulses.  Exam reveals no gallop and no friction rub.   No murmur heard. Pulmonary/Chest: Effort normal and breath sounds normal. No respiratory distress. He has no wheezes. He has no rales.  Abdominal: Soft. He exhibits no distension. There is no tenderness. There is no guarding.  Musculoskeletal: Normal range of motion. He exhibits no edema, tenderness or deformity.  Neurological: He is alert and oriented to person, place, and time. He exhibits normal muscle tone.  Face symmetric, tongue midline. 5/5 strength in the proximal and distal upper and lower extremities bilaterally, with intact sensation to light touch. Normal finger to nose, heel to shin, and rapid alternating movements. Normal speech. Normal gait.    Skin: Skin is  warm and dry.  Psychiatric: He has a normal mood and affect.  Nursing note and vitals reviewed.    ED Treatments / Results  Labs (all labs ordered are listed, but only abnormal results are displayed) Labs Reviewed  CBC - Abnormal; Notable for the following:       Result Value   Hemoglobin 12.4 (*)    HCT 38.1 (*)    All other components within normal limits  COMPREHENSIVE METABOLIC PANEL - Abnormal; Notable for the following:    Chloride 100 (*)    Glucose, Bld 305 (*)    Creatinine, Ser 1.53 (*)    Total Protein 5.9 (*)    Albumin 2.9 (*)    ALT 10 (*)    GFR calc non Af Amer 51 (*)    GFR calc Af Amer 59 (*)    All other components within normal limits  URINALYSIS, ROUTINE W REFLEX MICROSCOPIC    EKG  EKG Interpretation  Date/Time:  Monday September 13 2016 12:36:44 EDT Ventricular Rate:  89 PR Interval:    QRS Duration: 87 QT Interval:  391 QTC Calculation: 476 R Axis:   55  Text Interpretation:  Sinus rhythm Borderline repolarization abnormality Borderline prolonged QT interval T wave abnormality Abnormal ekg Confirmed by Gerhard MunchLOCKWOOD, ROBERT  MD [phone]([id number]) on 09/13/2016 1:00:40 PM       Radiology No results found.  Procedures Procedures (including critical care time)  Medications Ordered in ED Medications  amLODipine (NORVASC) tablet 10 mg (10 mg Oral Given 09/13/16 1248)  lisinopril (PRINIVIL,ZESTRIL) tablet 40 mg (40 mg Oral Given 09/13/16 1248)  metFORMIN (GLUCOPHAGE) tablet 500 mg (500 mg Oral Given 09/13/16 1458)     Initial Impression / Assessment and Plan / ED Course  I have reviewed the triage vital signs and the nursing notes.  Pertinent labs & imaging results that were available during my care of the patient were reviewed by me and considered in my medical decision making (see chart for details).     Patient is generally well-appearing. Afebrile and hemodynamically stable. Neuro exam unremarkable as above, and I doubt central etiology of the patient's  dizziness earlier in the day. Patient states he had similar symptoms in the past with hypertension. He is hypertensive with blood pressure of 190 systolic on arrival. He was given his home amlodipine as well as lisinopril, with improvement in SBP to 170 systolic. Labs reveal hyperglycemia to 305, but anion gap and bicarbonate are both normal. Doubt DKA. Creatinine is at baseline. CBC with no leukocytosis. EKG grossly unchanged from prior. Doubt end organ damage from his hypertension to suggest hypertensive urgency or emergency.  Patient counseled to please begin taking his home antihypertensives as well as anti-glycemic agents daily. He was discharged with his friend who will be taking him to the veterans home where he recently received housing.  Care of patient overseen by my attending, Dr. Jeraldine LootsLockwood.  Final Clinical Impressions(s) / ED Diagnoses   Final diagnoses:  Asymptomatic hypertension  Hyperglycemia    New Prescriptions Discharge Medication List as of 09/13/2016  2:36 PM       Ulyana Pitones Ernestina PennaBrunno Arely Tinner, MD 09/13/16 1803    Gerhard Munchobert Lockwood, MD 09/15/16 [phone]

## 2016-09-13 NOTE — ED Triage Notes (Signed)
[facility] pt was being checked in and had BP taken, pt had extremely high BP and c/o dizziness. Pt has a hx of same-- pt was confused for EMS, unable to answer some questions, but oriented to person, place.  IV 18G left AC per EMS

## 2016-11-04 ENCOUNTER — Emergency Department (HOSPITAL_BASED_OUTPATIENT_CLINIC_OR_DEPARTMENT_OTHER)
Admission: EM | Admit: 2016-11-04 | Discharge: 2016-11-04 | Disposition: A | Discharge status: Home / Self Care | Payer: No Typology Code available for payment source | Attending: Emergency Medicine | Admitting: Emergency Medicine

## 2016-11-04 ENCOUNTER — Encounter (HOSPITAL_BASED_OUTPATIENT_CLINIC_OR_DEPARTMENT_OTHER): Payer: Self-pay | Admitting: Emergency Medicine

## 2016-11-04 DIAGNOSIS — Z590 Homelessness: Secondary | ICD-10-CM | POA: Insufficient documentation

## 2016-11-04 DIAGNOSIS — Y9389 Activity, other specified: Secondary | ICD-10-CM | POA: Insufficient documentation

## 2016-11-04 DIAGNOSIS — Z7982 Long term (current) use of aspirin: Secondary | ICD-10-CM | POA: Insufficient documentation

## 2016-11-04 DIAGNOSIS — I10 Essential (primary) hypertension: Secondary | ICD-10-CM | POA: Insufficient documentation

## 2016-11-04 DIAGNOSIS — W19XXXA Unspecified fall, initial encounter: Secondary | ICD-10-CM

## 2016-11-04 DIAGNOSIS — R739 Hyperglycemia, unspecified: Secondary | ICD-10-CM

## 2016-11-04 DIAGNOSIS — E119 Type 2 diabetes mellitus without complications: Secondary | ICD-10-CM | POA: Insufficient documentation

## 2016-11-04 DIAGNOSIS — W1839XA Other fall on same level, initial encounter: Secondary | ICD-10-CM | POA: Insufficient documentation

## 2016-11-04 DIAGNOSIS — Z7984 Long term (current) use of oral hypoglycemic drugs: Secondary | ICD-10-CM | POA: Insufficient documentation

## 2016-11-04 DIAGNOSIS — Y9289 Other specified places as the place of occurrence of the external cause: Secondary | ICD-10-CM | POA: Insufficient documentation

## 2016-11-04 DIAGNOSIS — Y999 Unspecified external cause status: Secondary | ICD-10-CM | POA: Insufficient documentation

## 2016-11-04 DIAGNOSIS — Z79899 Other long term (current) drug therapy: Secondary | ICD-10-CM | POA: Insufficient documentation

## 2016-11-04 DIAGNOSIS — T07XXXA Unspecified multiple injuries, initial encounter: Secondary | ICD-10-CM

## 2016-11-04 DIAGNOSIS — Z23 Encounter for immunization: Secondary | ICD-10-CM | POA: Insufficient documentation

## 2016-11-04 DIAGNOSIS — S50811A Abrasion of right forearm, initial encounter: Secondary | ICD-10-CM | POA: Insufficient documentation

## 2016-11-04 DIAGNOSIS — S50812A Abrasion of left forearm, initial encounter: Secondary | ICD-10-CM | POA: Insufficient documentation

## 2016-11-04 DIAGNOSIS — S80212A Abrasion, left knee, initial encounter: Secondary | ICD-10-CM | POA: Insufficient documentation

## 2016-11-04 HISTORY — DX: Homelessness: Z59.0

## 2016-11-04 HISTORY — DX: Homelessness unspecified: Z59.00

## 2016-11-04 LAB — CBG MONITORING, ED: GLUCOSE-CAPILLARY: 262 mg/dL — AB (ref 65–99)

## 2016-11-04 MED ORDER — TETANUS-DIPHTH-ACELL PERTUSSIS 5-2.5-18.5 LF-MCG/0.5 IM SUSP
0.5000 mL | Freq: Once | INTRAMUSCULAR | Status: AC
  Administered 2016-11-04: 0.5 mL via INTRAMUSCULAR
  Filled 2016-11-04: qty 0.5

## 2016-11-04 MED ORDER — METFORMIN HCL 500 MG PO TABS
500.0000 mg | ORAL_TABLET | Freq: Once | ORAL | Status: AC
  Administered 2016-11-04: 500 mg via ORAL
  Filled 2016-11-04: qty 1

## 2016-11-04 NOTE — ED Notes (Signed)
Pt c/o bilateral knee and bilateral arm pain after rolling down a small embankment when he was trying to sleep.  Pt able to ambulate without apparent difficulty.

## 2016-11-04 NOTE — ED Notes (Signed)
Pt verbalizes understanding of d/c instructions and denies any further needs at this time. 

## 2016-11-04 NOTE — ED Provider Notes (Signed)
[facility] Provider Note: Lowella DellJ. Lane Milta Croson, MD, FACEP  CSN: [id number] MRN: [id number] ARRIVAL: 11/04/16 at 0450 ROOM: [facility]06/[facility]   CHIEF COMPLAINT  Fall   HISTORY OF PRESENT ILLNESS  Vincent Osborne is a 51 y.o. male with a history of homelessness, stroke and diabetes. He was sleeping under a bridge leaving against a post prior to arrival. When he tried to stand up he fell down. A bystander called EMS and he was brought here. He has some superficial abrasions to his forearms and left knee. Pain is mild. He has no significant bony pain. His blood sugar was noted to be elevated prior to arrival but is within his usual limits. He states he recently got his anti-hyperglycemics filled and has been taking them.   Past Medical History:  Diagnosis Date  . Diabetes mellitus   . Homelessness   . Hyperlipidemia   . Hypertension   . Whiplash    04/2012    Past Surgical History:  Procedure Laterality Date  . APPENDECTOMY      Family History  Problem Relation Age of Onset  . Diabetes Mother   . Diabetes Brother   . Hypertension Maternal Uncle     Social History  Substance Use Topics  . Smoking status: Never Smoker  . Smokeless tobacco: Never Used  . Alcohol use Yes    Prior to Admission medications   Medication Sig Start Date End Date Taking? Authorizing Provider  aspirin 81 MG tablet Take 81 mg by mouth daily.    [provider]  lisinopril (PRINIVIL,ZESTRIL) 20 MG tablet Take 1 tablet (20 mg total) by mouth daily. Patient not taking: Reported on 07/22/2016 07/11/16   Margarita Grizzleay, Danielle, MD  lisinopril-hydrochlorothiazide (ZESTORETIC) 20-12.5 MG tablet Take 1 tablet by mouth daily. 07/22/16   Kirichenko, Lemont Fillersatyana, PA-C  metFORMIN (GLUCOPHAGE) 500 MG tablet Take 1 tablet (500 mg total) by mouth 2 (two) times daily with a meal. Patient not taking: Reported on 07/22/2016 07/11/16   Margarita Grizzleay, Danielle, MD  metFORMIN (GLUCOPHAGE) 500 MG tablet Take 1 tablet (500 mg total) by  mouth 2 (two) times daily with a meal. 07/22/16   Kirichenko, Lemont Fillersatyana, PA-C    Allergies Penicillins   REVIEW OF SYSTEMS  Negative except as noted here or in the History of Present Illness.   PHYSICAL EXAMINATION  Initial Vital Signs Blood pressure (!) 161/85, pulse 85, temperature 98 F (36.7 C), temperature source Oral, resp. rate 16, height 6' (1.829 m), weight 200 lb (90.7 kg), SpO2 100 %.  Examination General: Well-developed, well-nourished male in no acute distress; appearance consistent with age of record HENT: normocephalic; atraumatic Eyes: pupils equal, round and reactive to light; extraocular muscles intact Neck: supple; no C-spine tenderness Heart: regular rate and rhythm Lungs: clear to auscultation bilaterally Abdomen: soft; nondistended; nontender; bowel sounds present Extremities: No deformity; full range of motion; pulses normal; no bony tenderness Neurologic: Awake, alert, slowness of speech; motor function intact in all extremities and symmetric; no facial droop Skin: Warm and dry; superficial abrasions of forearms and left knee Psychiatric: Flat affect   RESULTS  Summary of this visit's results, reviewed by myself:   EKG Interpretation  Date/Time:    Ventricular Rate:    PR Interval:    QRS Duration:   QT Interval:    QTC Calculation:   R Axis:     Text Interpretation:        Laboratory Studies: Results for orders placed or performed during the hospital encounter of 11/04/16 (  from the past 24 hour(s))  POC CBG, ED     Status: Abnormal   Collection Time: 11/04/16  4:59 AM  Result Value Ref Range   Glucose-Capillary 262 (H) 65 - 99 mg/dL   Imaging Studies: No results found.  ED COURSE  Nursing notes and initial vitals signs, including pulse oximetry, reviewed.  Vitals:   11/04/16 0455 11/04/16 0456  BP:  (!) 161/85  Pulse:  85  Resp:  16  Temp:  98 F (36.7 C)  TempSrc:  Oral  SpO2:  100%  Weight: 200 lb (90.7 kg)   Height: 6'  (1.829 m)    tDap updated.   PROCEDURES    ED DIAGNOSES     ICD-9-CM ICD-10-CM   1. Fall, initial encounter [phone] W19.XXXA   2. Abrasion, multiple sites 919.0 T07.XXXA   3. Hyperglycemia 790.29 R73.9        Paula Libra, MD 11/04/16 [phone]

## 2016-11-04 NOTE — ED Triage Notes (Signed)
Pt to ED with c/o fall and bilateral knee pain.

## 2017-03-05 ENCOUNTER — Encounter (HOSPITAL_COMMUNITY): Payer: Self-pay

## 2017-03-05 ENCOUNTER — Emergency Department (HOSPITAL_COMMUNITY): Payer: Self-pay

## 2017-03-05 ENCOUNTER — Emergency Department (HOSPITAL_COMMUNITY)
Admission: EM | Admit: 2017-03-05 | Discharge: 2017-03-05 | Disposition: A | Discharge status: Home / Self Care | Payer: Self-pay | Attending: Emergency Medicine | Admitting: Emergency Medicine

## 2017-03-05 DIAGNOSIS — E1165 Type 2 diabetes mellitus with hyperglycemia: Secondary | ICD-10-CM | POA: Insufficient documentation

## 2017-03-05 DIAGNOSIS — R000 Tachycardia, unspecified: Secondary | ICD-10-CM | POA: Insufficient documentation

## 2017-03-05 DIAGNOSIS — Z590 Homelessness: Secondary | ICD-10-CM | POA: Insufficient documentation

## 2017-03-05 DIAGNOSIS — R739 Hyperglycemia, unspecified: Secondary | ICD-10-CM

## 2017-03-05 DIAGNOSIS — I10 Essential (primary) hypertension: Secondary | ICD-10-CM | POA: Insufficient documentation

## 2017-03-05 LAB — URINALYSIS, ROUTINE W REFLEX MICROSCOPIC
Bacteria, UA: NONE SEEN
Bilirubin Urine: NEGATIVE
Glucose, UA: >=500 mg/dL — AB
KETONES UR: NEGATIVE mg/dL
Leukocytes, UA: NEGATIVE
Nitrite: NEGATIVE
PH: 6 (ref 5.0–8.0)
Protein, ur: >=300 mg/dL — AB
SQUAMOUS EPITHELIAL / LPF: NONE SEEN
Specific Gravity, Urine: 1.017 (ref 1.005–1.030)

## 2017-03-05 LAB — COMPREHENSIVE METABOLIC PANEL
ALK PHOS: 47 U/L (ref 38–126)
ALT: 13 U/L — AB (ref 17–63)
AST: 38 U/L (ref 15–41)
Albumin: 3.2 g/dL — ABNORMAL LOW (ref 3.5–5.0)
Anion gap: 8 (ref 5–15)
BILIRUBIN TOTAL: 0.7 mg/dL (ref 0.3–1.2)
BUN: 22 mg/dL — ABNORMAL HIGH (ref 6–20)
CHLORIDE: 104 mmol/L (ref 101–111)
CO2: 26 mmol/L (ref 22–32)
CREATININE: 1.65 mg/dL — AB (ref 0.61–1.24)
Calcium: 9.1 mg/dL (ref 8.9–10.3)
GFR, EST AFRICAN AMERICAN: 54 mL/min — AB (ref >60)
GFR, EST NON AFRICAN AMERICAN: 46 mL/min — AB (ref >60)
Glucose, Bld: 280 mg/dL — ABNORMAL HIGH (ref 65–99)
POTASSIUM: 3.9 mmol/L (ref 3.5–5.1)
Sodium: 138 mmol/L (ref 135–145)
TOTAL PROTEIN: 6.2 g/dL — AB (ref 6.5–8.1)

## 2017-03-05 LAB — CBG MONITORING, ED
GLUCOSE-CAPILLARY: 273 mg/dL — AB (ref 65–99)
Glucose-Capillary: 172 mg/dL — ABNORMAL HIGH (ref 65–99)

## 2017-03-05 LAB — CBC
HEMATOCRIT: 36.4 % — AB (ref 39.0–52.0)
Hemoglobin: 12.3 g/dL — ABNORMAL LOW (ref 13.0–17.0)
MCH: 27.8 pg (ref 26.0–34.0)
MCHC: 33.8 g/dL (ref 30.0–36.0)
MCV: 82.4 fL (ref 78.0–100.0)
Platelets: 195 10*3/uL (ref 150–400)
RBC: 4.42 MIL/uL (ref 4.22–5.81)
RDW: 13.1 % (ref 11.5–15.5)
WBC: 8.2 10*3/uL (ref 4.0–10.5)

## 2017-03-05 LAB — I-STAT TROPONIN, ED: TROPONIN I, POC: 0.05 ng/mL (ref 0.00–0.08)

## 2017-03-05 LAB — D-DIMER, QUANTITATIVE (NOT AT ARMC): D DIMER QUANT: 0.56 ug{FEU}/mL — AB (ref 0.00–0.50)

## 2017-03-05 LAB — TSH: TSH: 1.13 u[IU]/mL (ref 0.350–4.500)

## 2017-03-05 LAB — AMMONIA: AMMONIA: 48 umol/L — AB (ref 9–35)

## 2017-03-05 LAB — ETHANOL: Alcohol, Ethyl (B): <5 mg/dL (ref <5)

## 2017-03-05 MED ORDER — LISINOPRIL-HYDROCHLOROTHIAZIDE 20-12.5 MG PO TABS: 1.0000 | ORAL_TABLET | Freq: Every day | ORAL | 0 refills | Status: DC

## 2017-03-05 MED ORDER — IOPAMIDOL (ISOVUE-370) INJECTION 76%
INTRAVENOUS | Status: AC
  Administered 2017-03-05: 80 mL via INTRAVENOUS
  Filled 2017-03-05: qty 100

## 2017-03-05 MED ORDER — LISINOPRIL 20 MG PO TABS
20.0000 mg | ORAL_TABLET | Freq: Once | ORAL | Status: AC
  Administered 2017-03-05: 20 mg via ORAL
  Filled 2017-03-05: qty 1

## 2017-03-05 MED ORDER — HYDROCHLOROTHIAZIDE 12.5 MG PO CAPS
12.5000 mg | ORAL_CAPSULE | Freq: Once | ORAL | Status: AC
  Administered 2017-03-05: 12.5 mg via ORAL
  Filled 2017-03-05: qty 1

## 2017-03-05 MED ORDER — SODIUM CHLORIDE 0.9 % IV BOLUS (SEPSIS)
1000.0000 mL | Freq: Once | INTRAVENOUS | Status: AC
  Administered 2017-03-05: 1000 mL via INTRAVENOUS

## 2017-03-05 MED ORDER — METFORMIN HCL 500 MG PO TABS: 500.0000 mg | ORAL_TABLET | Freq: Two times a day (BID) | ORAL | 0 refills | Status: DC

## 2017-03-05 NOTE — ED Notes (Signed)
Pt had drawn for labs at this time:  Blue for D-dimer

## 2017-03-05 NOTE — Discharge Instructions (Signed)
it is very important that you take her medicines as prescribed. Stay well-hydrated with water.  It is very important that you follow up with your primary care doctor for management of your chronic conditions.  Return to the ER if you develop fevers, chills, chest pain, shortness of breath, vision changes, one-sided weakness or any new or worsening symptoms.

## 2017-03-05 NOTE — ED Triage Notes (Signed)
Patient present here after having a fall. Pt was recently discharged from hospital. Pt is homeless and was found outside.  Pt CBG per ems was 360. VS- BP- 160/100 P-130 RR-16 and 98% RA ems vitals.

## 2017-03-05 NOTE — ED Provider Notes (Signed)
[facility]-EMERGENCY DEPT Provider Note   CSN: [id number] Arrival date & time: 03/05/17  1237     History   Chief Complaint No chief complaint on file.   HPI Vincent Osborne is a 51 y.o. male presenting with hyperglycemia and dizziness.  Patient states that starting today, he started to feel like his blood sugars were high. When this happens, he has some dizziness. Patient states that he has not been taking his diabetes or blood pressure medicines, as he is out of meds. He is unable to say how long he has not been taking them. He states he has not fallen recently, and that a fall reported in the triage note was from the last time he was at the hospital. He states he was recently at [facility], however per chart review, patient has not been seen at [facility] since 09/13/16. He denies fever, chills, chest pain, shortness of breath, cough, and nausea, vomiting, abdominal pain, urinary symptoms, or abnormal bowel movements. He denies pain, fall, trauma, or injury. He denies weakness, numbness, tingling, or increased confusion. Patient past medical history significant for homelessness, hypertension, diabetes, and CVA.    HPI  Past Medical History:  Diagnosis Date  . Diabetes mellitus   . Homelessness   . Hyperlipidemia   . Hypertension   . Whiplash    04/2012    Patient Active Problem List   Diagnosis Date Noted  . Diabetic retinopathy of both eyes (HCC) 09/17/2014  . Midline low back pain with right-sided sciatica 09/17/2014  . Hyperglycemia 05/27/2014  . Carbuncle of leg, right 03/26/2014  . Onychomycosis 03/26/2014  . 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 Essential hypertension 12/20/2013  . DM (diabetes mellitus) (HCC) 09/19/2013  . Left knee pain 09/19/2013  . Acute CVA (cerebrovascular accident) (HCC) 02/28/2013  . HTN (hypertension) with goal to be determined 02/28/2013  . Diabetes (HCC) 02/28/2013    Past Surgical History:  Procedure Laterality  Date  . APPENDECTOMY         Home Medications    Prior to Admission medications   Medication Sig Start Date End Date Taking? Authorizing Provider  lisinopril-hydrochlorothiazide (ZESTORETIC) 20-12.5 MG tablet Take 1 tablet by mouth daily. 03/05/17   Linkon Siverson, PA-C  metFORMIN (GLUCOPHAGE) 500 MG tablet Take 1 tablet (500 mg total) by mouth 2 (two) times daily with a meal. 03/05/17   Katricia Prehn, PA-C    Family History Family History  Problem Relation Age of Onset  . Diabetes Mother   . Diabetes Brother   . Hypertension Maternal Uncle     Social History Social History  Substance Use Topics  . Smoking status: Never Smoker  . Smokeless tobacco: Never Used  . Alcohol use Yes     Allergies   Penicillins   Review of Systems Review of Systems  Neurological: Positive for dizziness.  All other systems reviewed and are negative.    Physical Exam Updated Vital Signs BP (!) 173/107   Pulse 85   Temp 98.3 F (36.8 C) (Oral)   Resp 11   Ht  (1.88 m)   Wt 90.7 kg (200 lb)   SpO2 100%   BMI 25.68 kg/m   Physical Exam  Constitutional: He is oriented to person, place, and time. He appears well-developed and well-nourished. No distress.  HENT:  Head: Normocephalic and atraumatic.  Right Ear: Tympanic membrane, external ear and ear canal normal.  Left Ear: Tympanic membrane, external ear and ear canal normal.  Nose: Nose  normal.  Mouth/Throat: Uvula is midline, oropharynx is clear and moist and mucous membranes are normal.  Eyes: Pupils are equal, round, and reactive to light. Conjunctivae and EOM are normal.  Neck: Normal range of motion.  Cardiovascular: Regular rhythm and intact distal pulses.  Tachycardia present.   Pulmonary/Chest: Effort normal and breath sounds normal. No respiratory distress. He has no wheezes. He exhibits no tenderness.  Abdominal: Soft. Bowel sounds are normal. He exhibits no distension and no mass. There is no tenderness.  There is no rebound and no guarding.  Musculoskeletal: Normal range of motion.  Strength intact 4. Sensation intact 4. Radial and pedal pulses equal bilaterally.  Neurological: He is alert and oriented to person, place, and time. He has normal strength. No cranial nerve deficit or sensory deficit. He displays a negative Romberg sign. GCS eye subscore is 4. GCS verbal subscore is 5. GCS motor subscore is 6.  Patient responds to questions appropriately, however is slow to respond.  Skin: Skin is warm and dry.  Psychiatric: He has a normal mood and affect. He is slowed.  Nursing note and vitals reviewed.    ED Treatments / Results  Labs (all labs ordered are listed, but only abnormal results are displayed) Labs Reviewed  CBC - Abnormal; Notable for the following:       Result Value   Hemoglobin 12.3 (*)    HCT 36.4 (*)    All other components within normal limits  COMPREHENSIVE METABOLIC PANEL - Abnormal; Notable for the following:    Glucose, Bld 280 (*)    BUN 22 (*)    Creatinine, Ser 1.65 (*)    Total Protein 6.2 (*)    Albumin 3.2 (*)    ALT 13 (*)    GFR calc non Af Amer 46 (*)    GFR calc Af Amer 54 (*)    All other components within normal limits  URINALYSIS, ROUTINE W REFLEX MICROSCOPIC - Abnormal; Notable for the following:    Glucose, UA >=500 (*)    Hgb urine dipstick SMALL (*)    Protein, ur >=300 (*)    All other components within normal limits  D-DIMER, QUANTITATIVE (NOT AT [facility]) - Abnormal; Notable for the following:    D-Dimer, Quant 0.56 (*)    All other components within normal limits  AMMONIA - Abnormal; Notable for the following:    Ammonia 48 (*)    All other components within normal limits  CBG MONITORING, ED - Abnormal; Notable for the following:    Glucose-Capillary 273 (*)    All other components within normal limits  CBG MONITORING, ED - Abnormal; Notable for the following:    Glucose-Capillary 172 (*)    All other components within normal  limits  TSH  ETHANOL  I-STAT TROPONIN, ED    EKG  EKG Interpretation  Date/Time:  Saturday March 05 2017 12:54:56 EDT Ventricular Rate:  125 PR Interval:    QRS Duration: 79 QT Interval:  321 QTC Calculation: 463 R Axis:   57 Text Interpretation:  Sinus tachycardia Abnormal R-wave progression, early transition Nonspecific T abnormalities, diffuse leads No significant change since last tracing Confirmed by Richardean Canal [phone]) on 03/05/2017 2:44:11 PM       Radiology Dg Chest 2 View  Result Date: 03/05/2017 CLINICAL DATA:  Lethargy. EXAM: CHEST  2 VIEW COMPARISON:  01/31/2017; 07/02/2016; 08/08/2014 FINDINGS: Grossly unchanged cardiac silhouette and mediastinal contours. No focal parenchymal opacities. No pleural effusion or pneumothorax. No evidence  of edema. No acute osseus abnormalities. IMPRESSION: No acute cardiopulmonary disease. Electronically Signed   By: Simonne Come M.D.   On: 03/05/2017 14:49   Ct Head Wo Contrast  Result Date: 03/05/2017 CLINICAL DATA:  Status post fall, altered mental status. EXAM: CT HEAD WITHOUT CONTRAST TECHNIQUE: Contiguous axial images were obtained from the base of the skull through the vertex without intravenous contrast. COMPARISON:  Head CT dated 10/24/2016, brain MRI dated 02/28/2013. FINDINGS: Brain: Ventricles are within normal limits in size and configuration. There is an old infarct centered within the left lentiform nucleus, with associated encephalomalacia. There are is no mass, hemorrhage, edema or other evidence of acute parenchymal abnormality. No extra-axial hemorrhage. Vascular: No hyperdense vessel or unexpected calcification. Skull: Normal. Negative for fracture or focal lesion. Sinuses/Orbits: No acute finding. Other: None. IMPRESSION: 1. No acute findings. No intracranial mass, hemorrhage or edema. No skull fracture. 2. Small old infarct within the left lentiform nucleus. Electronically Signed   By: Bary Richard M.D.   On: 03/05/2017  15:24   Ct Angio Chest Pe W/cm &/or Wo Cm  Result Date: 03/05/2017 CLINICAL DATA:  Fall. Suspect pulmonary embolism. History of diabetes and stroke, hypertension. EXAM: CT ANGIOGRAPHY CHEST WITH CONTRAST TECHNIQUE: Multidetector CT imaging of the chest was performed using the standard protocol during bolus administration of intravenous contrast. Multiplanar CT image reconstructions and MIPs were obtained to evaluate the vascular anatomy. CONTRAST:  80 cc Isovue 370 COMPARISON:  Chest radiograph March 05, 2017 FINDINGS: CARDIOVASCULAR: Adequate contrast opacification of the pulmonary artery's. Main pulmonary artery is not enlarged. No pulmonary arterial filling defects to the level of the subsegmental branches. 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 Heart size is normal, no right heart strain. No pericardial effusion. Thoracic aorta is normal course and caliber, trace calcific atherosclerosis. MEDIASTINUM/NODES: No lymphadenopathy by CT size criteria. Small bilateral axillary lymph nodes. LUNGS/PLEURA: Tracheobronchial tree is patent, no pneumothorax. No pleural effusions, focal consolidations, or masses. Bibasilar dependent atelectasis. Faint ground-glass centrilobular pulmonary nodules RIGHT middle lobe. UPPER ABDOMEN: Included view of the abdomen is unremarkable. MUSCULOSKELETAL: Visualized soft tissues and included osseous structures appear normal. Review of the MIP images confirms the above findings. IMPRESSION: 1. No acute pulmonary embolism. 2. Faint ground-glass pulmonary nodules RIGHT middle lobe may be infectious or inflammatory. 3. 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 Aortic Atherosclerosis (ICD10-I70.0). Electronically Signed   By: Awilda Metro M.D.   On: 03/05/2017 22:20    Procedures Procedures (including critical care time)  Medications Ordered in ED Medications  sodium chloride 0.9 % bolus  1,000 mL (0 mLs Intravenous Stopped 03/05/17 1833)  lisinopril (PRINIVIL,ZESTRIL) tablet 20 mg (20 mg Oral Given 03/05/17 1840)  hydrochlorothiazide (MICROZIDE) capsule 12.5 mg (12.5 mg Oral Given 03/05/17 1840)  iopamidol (ISOVUE-370) 76 % injection (80 mLs Intravenous Contrast Given 03/05/17 2153)     Initial Impression / Assessment and Plan / ED Course  I have reviewed the triage vital signs and the nursing notes.  Pertinent labs & imaging results that were available during my care of the patient were reviewed by me and considered in my medical decision making (see chart for details).     Patient presenting with some dizziness and elevated blood sugar. Physical exam shows patient without neurologic deficits. Patient is slow to respond, however this appears to be baseline for him. Will order troponin, UA, ammonia, CMP, CBC, ethanol, TSH, d-dimer. EKG chest x-ray, and CT  head ordered. Will start IV fluids.  Labs reassuring. Slight elevation in creatinine, 1.65 with baseline 1.4. No leukocytosis. UA negative for infection. Troponin negative. EKG shows sinus tach. Chest x-ray negative, CT head negative. Heart rate improved with IV fluids. Blood pressure is elevated, baseline per patient. He is not taking any of his medicines. Will give 1 dose of at-home medicines. Discussed case with attending, Dr. Silverio LayYao evaluated the patient. Waiting on d-dimer to rule out PE.  Discussed with lab 3 times regarding d-dimer. Was told initial sample was unlabeled and therefore could not be used. Was told second sample was being run against samples. Third time was told there was never a second sample, and new lab needs to be drawn.  D-dimer slightly elevated at 0.56. Will order CTA to rule out PE.   CTA negative. Discussed findings with pt. Pt denies any dizziness currently. Patient is more aware and slightly faster to respond. Dizziness likely due to hyperglycemia and high blood pressure. Discussed at length the importance  of follow-up with PCP and taking his medications. Will prescribe one-month dose of medications. At this time, patient appears safe for discharge. Strict return precautions given. Patient states he understands and agrees to plan.   Final Clinical Impressions(s) / ED Diagnoses   Final diagnoses:  Hypertension, unspecified type  Hyperglycemia    New Prescriptions Discharge Medication List as of 03/05/2017 10:28 PM       Alveria ApleyCaccavale, Haely Leyland, PA-C 03/05/17 2258    Charlynne PanderYao, David Hsienta, MD 03/06/17 0900

## 2017-03-06 ENCOUNTER — Emergency Department (HOSPITAL_COMMUNITY)
Admission: EM | Admit: 2017-03-06 | Discharge: 2017-03-06 | Disposition: A | Discharge status: Home / Self Care | Payer: No Typology Code available for payment source | Attending: Emergency Medicine | Admitting: Emergency Medicine

## 2017-03-06 ENCOUNTER — Encounter (HOSPITAL_COMMUNITY): Payer: Self-pay | Admitting: Emergency Medicine

## 2017-03-06 DIAGNOSIS — Z5900 Homelessness unspecified: Secondary | ICD-10-CM

## 2017-03-06 DIAGNOSIS — Z7984 Long term (current) use of oral hypoglycemic drugs: Secondary | ICD-10-CM | POA: Insufficient documentation

## 2017-03-06 DIAGNOSIS — E119 Type 2 diabetes mellitus without complications: Secondary | ICD-10-CM | POA: Insufficient documentation

## 2017-03-06 DIAGNOSIS — Z87820 Personal history of traumatic brain injury: Secondary | ICD-10-CM | POA: Insufficient documentation

## 2017-03-06 DIAGNOSIS — I10 Essential (primary) hypertension: Secondary | ICD-10-CM | POA: Insufficient documentation

## 2017-03-06 DIAGNOSIS — R4182 Altered mental status, unspecified: Secondary | ICD-10-CM | POA: Insufficient documentation

## 2017-03-06 DIAGNOSIS — R918 Other nonspecific abnormal finding of lung field: Secondary | ICD-10-CM | POA: Insufficient documentation

## 2017-03-06 DIAGNOSIS — Z79899 Other long term (current) drug therapy: Secondary | ICD-10-CM | POA: Insufficient documentation

## 2017-03-06 LAB — CBG MONITORING, ED: Glucose-Capillary: 333 mg/dL — ABNORMAL HIGH (ref 65–99)

## 2017-03-06 NOTE — ED Provider Notes (Signed)
[facility] DEPT Provider Note   CSN: [id number] Arrival date & time: 03/06/17  1210     History   Chief Complaint Chief Complaint  Patient presents with  . SW consult    HPI Vincent Osborne is a 51 y.o. male.  The history is provided by the patient. No language interpreter was used.    Vincent Osborne is a 51 y.o. male who presents to the Emergency Department complaining of homeless.  He was evaluated in the emergency department yesterday and discharged. He waited in the lobby overnight and became tearful this morning when nobody came to pick him up. He denies any SI or HI. He is requesting help with the place to live.  Past Medical History:  Diagnosis Date  . Diabetes mellitus   . Homelessness   . Hyperlipidemia   . Hypertension   . Whiplash    04/2012    Patient Active Problem List   Diagnosis Date Noted  . Diabetic retinopathy of both eyes ([place]) 09/17/2014  . Midline low back pain with right-sided sciatica 09/17/2014  . Hyperglycemia 05/27/2014  . Carbuncle of leg, right 03/26/2014  . Onychomycosis 03/26/2014  . 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 Essential hypertension 12/20/2013  . DM (diabetes mellitus) ([place]) 09/19/2013  . Left knee pain 09/19/2013  . Acute CVA (cerebrovascular accident) ([place]) 02/28/2013  . HTN (hypertension) with goal to be determined 02/28/2013  . Diabetes ([place]) 02/28/2013    Past Surgical History:  Procedure Laterality Date  . APPENDECTOMY         Home Medications    Prior to Admission medications   Medication Sig Start Date End Date Taking? Authorizing Provider  lisinopril-hydrochlorothiazide (ZESTORETIC) 20-12.5 MG tablet Take 1 tablet by mouth daily. Patient not taking: Reported on 03/06/2017 03/05/17   Caccavale, Sophia, PA-C  metFORMIN (GLUCOPHAGE) 500 MG tablet Take 1 tablet (500 mg total) by mouth 2 (two) times daily with a meal. Patient not taking: Reported on 03/06/2017 03/05/17   Caccavale, Sophia,  PA-C    Family History Family History  Problem Relation Age of Onset  . Diabetes Mother   . Diabetes Brother   . Hypertension Maternal Uncle     Social History Social History  Substance Use Topics  . Smoking status: Never Smoker  . Smokeless tobacco: Never Used  . Alcohol use Yes     Allergies   Penicillins   Review of Systems Review of Systems  All other systems reviewed and are negative.    Physical Exam Updated Vital Signs BP (!) 146/87 (BP Location: Right Arm)   Pulse 96   Temp 98.5 F (36.9 C) (Oral)   Resp 18   SpO2 100%   Physical Exam  Constitutional: He is oriented to person, place, and time. He appears well-developed and well-nourished.  HENT:  Head: Normocephalic and atraumatic.  Cardiovascular: Normal rate and regular rhythm.   No murmur heard. Pulmonary/Chest: Effort normal and breath sounds normal. No respiratory distress.  Abdominal: Soft. There is no tenderness. There is no rebound and no guarding.  Musculoskeletal: He exhibits no edema or tenderness.  Neurological: He is alert and oriented to person, place, and time.  Confusion regarding past memories and events.  Moves all extremities symmetrically  Skin: Skin is warm and dry.  Psychiatric: He has a normal mood and affect. His behavior is normal.  Nursing note and vitals reviewed.    ED Treatments / Results  Labs (all labs ordered are listed, but only abnormal results are  displayed) Labs Reviewed - No data to display  EKG  EKG Interpretation None       Radiology Dg Chest 2 View  Result Date: 03/05/2017 CLINICAL DATA:  Lethargy. EXAM: CHEST  2 VIEW COMPARISON:  01/31/2017; 07/02/2016; 08/08/2014 FINDINGS: Grossly unchanged cardiac silhouette and mediastinal contours. No focal parenchymal opacities. No pleural effusion or pneumothorax. No evidence of edema. No acute osseus abnormalities. IMPRESSION: No acute cardiopulmonary disease. Electronically Signed   By: Sandi Mariscal M.D.    On: 03/05/2017 14:49   Ct Head Wo Contrast  Result Date: 03/05/2017 CLINICAL DATA:  Status post fall, altered mental status. EXAM: CT HEAD WITHOUT CONTRAST TECHNIQUE: Contiguous axial images were obtained from the base of the skull through the vertex without intravenous contrast. COMPARISON:  Head CT dated 10/24/2016, brain MRI dated 02/28/2013. FINDINGS: Brain: Ventricles are within normal limits in size and configuration. There is an old infarct centered within the left lentiform nucleus, with associated encephalomalacia. There are is no mass, hemorrhage, edema or other evidence of acute parenchymal abnormality. No extra-axial hemorrhage. Vascular: No hyperdense vessel or unexpected calcification. Skull: Normal. Negative for fracture or focal lesion. Sinuses/Orbits: No acute finding. Other: None. IMPRESSION: 1. No acute findings. No intracranial mass, hemorrhage or edema. No skull fracture. 2. Small old infarct within the left lentiform nucleus. Electronically Signed   By: Franki Cabot M.D.   On: 03/05/2017 15:24   Ct Angio Chest Pe W/cm &/or Wo Cm  Result Date: 03/05/2017 CLINICAL DATA:  Fall. Suspect pulmonary embolism. History of diabetes and stroke, hypertension. EXAM: CT ANGIOGRAPHY CHEST WITH CONTRAST TECHNIQUE: Multidetector CT imaging of the chest was performed using the standard protocol during bolus administration of intravenous contrast. Multiplanar CT image reconstructions and MIPs were obtained to evaluate the vascular anatomy. CONTRAST:  80 cc Isovue 370 COMPARISON:  Chest radiograph March 05, 2017 FINDINGS: CARDIOVASCULAR: Adequate contrast opacification of the pulmonary artery's. Main pulmonary artery is not enlarged. No pulmonary arterial filling defects to the level of the subsegmental branches. 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 Heart size is normal, no right heart strain. No pericardial effusion. Thoracic aorta is normal course and  caliber, trace calcific atherosclerosis. MEDIASTINUM/NODES: No lymphadenopathy by CT size criteria. Small bilateral axillary lymph nodes. LUNGS/PLEURA: Tracheobronchial tree is patent, no pneumothorax. No pleural effusions, focal consolidations, or masses. Bibasilar dependent atelectasis. Faint ground-glass centrilobular pulmonary nodules RIGHT middle lobe. UPPER ABDOMEN: Included view of the abdomen is unremarkable. MUSCULOSKELETAL: Visualized soft tissues and included osseous structures appear normal. Review of the MIP images confirms the above findings. IMPRESSION: 1. No acute pulmonary embolism. 2. Faint ground-glass pulmonary nodules RIGHT middle lobe may be infectious or inflammatory. 3. 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 Aortic Atherosclerosis (ICD10-I70.0). Electronically Signed   By: Elon Alas M.D.   On: 03/05/2017 22:20    Procedures Procedures (including critical care time)  Medications Ordered in ED Medications - No data to display   Initial Impression / Assessment and Plan / ED Course  I have reviewed the triage vital signs and the nursing notes.  Pertinent labs & imaging results that were available during my care of the patient were reviewed by me and considered in my medical decision making (see chart for details).     Patient here seeking placement. He does not reside in a group home although he was stating that he is waiting for the group home to come pick,. This was over  5 months since he last lives in a group home and he is currently homeless. He met with social work and is provided resources for shelter where he is previously stated. He has no evidence of acute medical or psychiatric illness at this time.  Final Clinical Impressions(s) / ED Diagnoses   Final diagnoses:  Homelessness    New Prescriptions New Prescriptions   No medications on file     Quintella Reichert, MD 03/06/17 1651

## 2017-03-06 NOTE — ED Notes (Signed)
Bed: [id number] Expected date: 03/04/17 Expected time: 6:00 PM Means of arrival:  Comments:

## 2017-03-06 NOTE — ED Notes (Addendum)
Pt was found by security in waiting area crying. Pt reports that he was discharged yesterday and no one had arrived from group home to pick him up. Pt appears distraught and tearful, "I don't know what to do." Pt placed in TCU room for SW consult. [employer] officer made aware and has placed a call to do check at group home. Pt ambulates to TCU with security and charge RN w/o difficulty. Pt in NAD

## 2017-03-06 NOTE — Progress Notes (Addendum)
CSW received a call from ED secreatary saying pt was left in waiting room after not being picked up by his group home.  Per notes, "Pt was found by security in waiting area crying. Pt reports that he was discharged yesterday and no one had arrived from group home to pick him up. Pt appears distraught and tearful, "I don't know what to do." Pt placed in TCU room for SW consult. [employer] officer made aware and has placed a call to do check at group home. Pt ambulates to TCU with security and charge RN w/o difficulty. Pt in NAD".  Per notes, there are no group homes listed.  Per providrr's note on 9/9 pt did not come from group home but was brought to the ED after being picked up at a restaurant.  Per provider's note on 9/9: Vincent Osborne is a 51 y.o. male here with dizziness. He is homeless and was picked up at a restaurant. Slow to respond, apparently that was baseline. Had no complaints to me.   CSW called CN and made CN aware pt is homeless per notes.  Per CN [employer] is following up to find pt's caregiver and [employer] has pt's address and phone and [employer] is sending someone to the caregiver's home now to seek information and make caregiver aware.  1:09 PM Per [employer] who followed up and [employer] was [facility] by the pt's former address which used to house the pt's group home.  1:18 PM Per [employer] pt's daughter is: Vincent Osborne [address], [address], [place], [place] [id number] DOB 05/12/1993 Age: 8123  CSW will continue to follow.  Dorothe PeaJonathan F. Lyal Husted, LCSW, LCAS, CSI Clinical Social Worker Ph: [phone]

## 2017-03-06 NOTE — ED Triage Notes (Signed)
Pt comes from lobby in a hysterical manner seeming very confused and upset.  Pt was here earlier with complaint of homelessness and saw social work.  Social work needed to consult again due to patient having a TBI and unable to care for himself. Pt unable to navigate bus system and is unsafe to leave independently.  Pt hypertensive earlier and returns the same.  Pt ambulatory.

## 2017-03-06 NOTE — ED Notes (Signed)
Homeless Vet Hotline  [phone]

## 2017-03-06 NOTE — Progress Notes (Addendum)
CSW reviewed chart and per notes pt is well-known to the [facility] ED.  CSW stafed case with CSW Asst Director and CSW Asst Director recommends D/C as pt does not meet criteria for a psyche consult and is medically D/C'd.    CSW will provide [facility] with [facility], [facility], a [facility]ter list and the [facility].  CSW will update EDP.  Please reconsult if future social work needs arise.  CSW signing off, as social work intervention is no longer needed.  Dorothe PeaJonathan F. Kaleea Penner, LCSW, LCAS, CSI Clinical Social Worker Ph: [phone]

## 2017-03-07 ENCOUNTER — Emergency Department (HOSPITAL_COMMUNITY): Payer: Self-pay

## 2017-03-07 ENCOUNTER — Emergency Department (HOSPITAL_COMMUNITY)
Admission: EM | Admit: 2017-03-07 | Discharge: 2017-03-07 | Disposition: A | Discharge status: Home / Self Care | Payer: Self-pay | Attending: Emergency Medicine | Admitting: Emergency Medicine

## 2017-03-07 ENCOUNTER — Encounter (HOSPITAL_COMMUNITY): Payer: Self-pay | Admitting: Radiology

## 2017-03-07 ENCOUNTER — Encounter: Payer: Self-pay | Admitting: Pediatric Intensive Care

## 2017-03-07 DIAGNOSIS — Z5900 Homelessness unspecified: Secondary | ICD-10-CM

## 2017-03-07 DIAGNOSIS — I10 Essential (primary) hypertension: Secondary | ICD-10-CM

## 2017-03-07 HISTORY — DX: Unspecified intracranial injury with loss of consciousness of unspecified duration, initial encounter: S06.9X9A

## 2017-03-07 HISTORY — DX: Unspecified intracranial injury with loss of consciousness status unknown, initial encounter: S06.9XAA

## 2017-03-07 MED ORDER — METFORMIN HCL 500 MG PO TABS: 500.0000 mg | ORAL_TABLET | Freq: Two times a day (BID) | ORAL | 0 refills | Status: DC

## 2017-03-07 MED ORDER — HYDROCHLOROTHIAZIDE 12.5 MG PO CAPS
12.5000 mg | ORAL_CAPSULE | Freq: Once | ORAL | Status: AC
  Administered 2017-03-07: 12.5 mg via ORAL
  Filled 2017-03-07: qty 1

## 2017-03-07 MED ORDER — LISINOPRIL 10 MG PO TABS
10.0000 mg | ORAL_TABLET | Freq: Once | ORAL | Status: AC
  Administered 2017-03-07: 10 mg via ORAL
  Filled 2017-03-07: qty 1

## 2017-03-07 MED ORDER — LISINOPRIL-HYDROCHLOROTHIAZIDE 20-12.5 MG PO TABS: 1.0000 | ORAL_TABLET | Freq: Every day | ORAL | 0 refills | Status: DC

## 2017-03-07 NOTE — ED Provider Notes (Signed)
CM/SW has evaluated the pt. They have arranged for direct taxi ride to a shelter which will accept pt, and reportedly has SW available at shelter to facilitate placement back into a group home and help pt navigate health system. He is o/w well appearing, is in agreement with this plan. CT head neg. BP remains elevated but he has no HA, vision changes, CP, SOB, or signs of HTN urgency ro emergency. I have provided Rx for his home antihypertensives.   Shaune PollackIsaacs, Kaylin Marcon, MD 03/07/17 2002

## 2017-03-07 NOTE — Discharge Instructions (Signed)
Mr. Vincent Osborne needs to meet with a SOCIAL WORKER for potential help with placing him back in a GROUP HOME, with assistance obtaining his medications and navigating his care, due to his underlying mental condition.

## 2017-03-07 NOTE — ED Provider Notes (Signed)
Asked to evaluate pt this AM. Pt is calm, cooperative on my exam. He does c/o headache. He appears mildly confused, which is baseline per records. However, in the setting of recent fall and increasing HTN, will check CT. Will o/w given his PO antihypertensives, plan to monitor. Suspect this is all 2/2 his underlying TBI. No blood thinner use.   Shaune PollackIsaacs, Georg Ang, MD 03/07/17 [phone]
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
## 2017-03-07 NOTE — Progress Notes (Signed)
CSW aware of consult- will meet with patient asap.   Stacy GardnerErin Keiran Gaffey, [facility] Emergency Room Clinical Social Worker [phone]

## 2017-03-07 NOTE — Progress Notes (Addendum)
UPDATE 3PM: CSW contacted [facility], Mariah MillingMichael Bagundo [phone], and [facility] to provide [facility] with additional resources for [facility]- however no return call from either facility. CSW was able to reach the [place] outreach representative [facility] who will follow up with CSW to provide further resources/ services for patient.    Patient was provided taxi voucher to the [facility] and resources for additional shelters/ VA resources and the [facility] Homeless Hotline at [phone].  11AM: CSW notes patient was discharged from the hospital last evening and given shelter resources. Patient has history of TBI and is confused at baseline. Patient states he is a veteran however this documentation is not in our records. CSW noted information for a daughter Vincent Osborne [address], [place] [address] [address], [place], [place] [id number] DOB 05/12/1993 Age: [age]- requested [place] to notify daughter of patients location and contact CSW. Per notes from 07/22/2016, patient was homeless [facility] to [facility] there.   CSW contacted emergency contacts listed all were disconnected:  [place] Other [phone]  [phone]   [address] [place] [address] Phone [phone] [facility](Home) [phone] (Mobile)   CSW will contact the [place] to determine if patient has VA benefits. CSW will continue to update.   Vincent GardnerErin Romir Osborne, [facility]CSWA Emergency Room Clinical Social Worker [phone]
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
## 2017-03-07 NOTE — ED Notes (Signed)
Bed: [id number] Expected date:  Expected time:  Means of arrival:  [facility]

## 2017-03-08 MED FILL — LISINOPRIL-HCTZ 20-12.5 MG: 20-12.5 | 30 days supply | Qty: 30 | Fill #0

## 2017-03-08 MED FILL — metFORMIN HCL 500 MG TABS: 500 | 30 days supply | Qty: 60 | Fill #0

## 2017-03-09 DIAGNOSIS — Z5321 Procedure and treatment not carried out due to patient leaving prior to being seen by health care provider: Secondary | ICD-10-CM | POA: Insufficient documentation

## 2017-03-09 DIAGNOSIS — Z590 Homelessness: Secondary | ICD-10-CM | POA: Insufficient documentation

## 2017-03-09 NOTE — ED Triage Notes (Signed)
Patient is homeless, [facility] sitting outside. EMS reports he had one vomiting episode and felt better. However, he is a diabetic and had a blood sugar of 349mg /dL. Patient reported to EMS he has been seen at the ED for his blood sugar 3 times ain the last 2 weeks. Furthermore, he reported to EMS that he had ate breakfast this morning and ate gummy bears today. Patient is alert, oriented x 3, and appears in no acute distress.

## 2017-03-10 ENCOUNTER — Emergency Department (HOSPITAL_COMMUNITY)
Admission: EM | Admit: 2017-03-10 | Discharge: 2017-03-10 | Disposition: A | Discharge status: Home / Self Care | Payer: No Typology Code available for payment source | Attending: Emergency Medicine | Admitting: Emergency Medicine

## 2017-03-10 NOTE — ED Notes (Signed)
Called  No response from lobby 
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
## 2017-05-13 ENCOUNTER — Encounter (HOSPITAL_COMMUNITY): Payer: Self-pay | Admitting: Nurse Practitioner

## 2017-05-13 DIAGNOSIS — E119 Type 2 diabetes mellitus without complications: Secondary | ICD-10-CM | POA: Insufficient documentation

## 2017-05-13 DIAGNOSIS — I10 Essential (primary) hypertension: Secondary | ICD-10-CM | POA: Insufficient documentation

## 2017-05-13 DIAGNOSIS — Z590 Homelessness: Secondary | ICD-10-CM | POA: Insufficient documentation

## 2017-05-13 NOTE — ED Triage Notes (Signed)
Pt states he needs help, he doesn't know where to go and is confused.

## 2017-05-14 ENCOUNTER — Emergency Department (HOSPITAL_COMMUNITY)
Admission: EM | Admit: 2017-05-14 | Discharge: 2017-05-14 | Disposition: A | Discharge status: Home / Self Care | Payer: No Typology Code available for payment source | Attending: Emergency Medicine | Admitting: Emergency Medicine

## 2017-05-14 ENCOUNTER — Other Ambulatory Visit: Payer: Self-pay

## 2017-05-14 DIAGNOSIS — I10 Essential (primary) hypertension: Secondary | ICD-10-CM

## 2017-05-14 DIAGNOSIS — Z5900 Homelessness unspecified: Secondary | ICD-10-CM

## 2017-05-14 DIAGNOSIS — R519 Headache, unspecified: Secondary | ICD-10-CM

## 2017-05-14 DIAGNOSIS — R51 Headache: Secondary | ICD-10-CM

## 2017-05-14 LAB — CBG MONITORING, ED: Glucose-Capillary: 240 mg/dL — ABNORMAL HIGH (ref 65–99)

## 2017-05-14 MED ORDER — IBUPROFEN 200 MG PO TABS
600.0000 mg | ORAL_TABLET | Freq: Once | ORAL | Status: AC
  Administered 2017-05-14: 600 mg via ORAL
  Filled 2017-05-14: qty 3

## 2017-05-14 MED ORDER — HYDROCHLOROTHIAZIDE 12.5 MG PO CAPS
12.5000 mg | ORAL_CAPSULE | Freq: Once | ORAL | Status: AC
  Administered 2017-05-14: 12.5 mg via ORAL
  Filled 2017-05-14: qty 1

## 2017-05-14 MED ORDER — METFORMIN HCL 500 MG PO TABS: 500.0000 mg | ORAL_TABLET | Freq: Two times a day (BID) | ORAL | 0 refills | Status: AC

## 2017-05-14 MED ORDER — METFORMIN HCL 500 MG PO TABS
500.0000 mg | ORAL_TABLET | Freq: Once | ORAL | Status: AC
  Administered 2017-05-14: 500 mg via ORAL
  Filled 2017-05-14: qty 1

## 2017-05-14 MED ORDER — ACETAMINOPHEN 325 MG PO TABS
650.0000 mg | ORAL_TABLET | Freq: Once | ORAL | Status: AC
  Administered 2017-05-14: 650 mg via ORAL
  Filled 2017-05-14: qty 2

## 2017-05-14 MED ORDER — LISINOPRIL 20 MG PO TABS
20.0000 mg | ORAL_TABLET | Freq: Once | ORAL | Status: AC
  Administered 2017-05-14: 20 mg via ORAL
  Filled 2017-05-14: qty 1

## 2017-05-14 MED ORDER — LISINOPRIL-HYDROCHLOROTHIAZIDE 20-12.5 MG PO TABS: 1.0000 | ORAL_TABLET | Freq: Every day | ORAL | 0 refills | Status: DC

## 2017-05-14 NOTE — Discharge Instructions (Signed)
I have re-filled your blood pressure and diabetes medicine.  Please read the resources I have provided for you.

## 2017-05-14 NOTE — ED Notes (Signed)
Pt sleeping in lobby. Has been given blanket and has steady, unlabored respirations. No complaints at this time.

## 2017-05-14 NOTE — ED Notes (Signed)
Patient is not having any symptoms. He wants help getting a home.

## 2017-05-14 NOTE — ED Notes (Signed)
Pt sleeping in lobby. Steady rise and fall of respirations noted.  

## 2017-05-14 NOTE — Progress Notes (Signed)
CSW received a consult for homelessness. CSW engaged with pt at pt's bedside and introduced herself and her role as a Child psychotherapistsocial worker. Pt states that [facility] but was told recently that he could no longer stay there. CSW provided brief supportive therapy and emotional support. CSW provided the pt with resources to multiple other homeless shelters in the area and also gave pt a list of Food Pantries for meals. Pt expressed his appreication and states that he had no other needs at this time. No further social work needs identified. CSW signing off but is available should another need arise.  Vincent JordanLynn B Osborne, MSW, [facility] ED Social Worker Covering

## 2017-05-14 NOTE — ED Provider Notes (Signed)
[facility]-EMERGENCY DEPT Provider Note   CSN: [id number] Arrival date & time: 05/13/17  1946     History   Chief Complaint Chief Complaint  Patient presents with  . Homeless  . Requesting Help    HPI Vincent Osborne is a 51 y.o. male with a history of homelessness, HTN, CVA, DM, TBI, who presents requesting help with homelessness.  He says he doesn't know where to go to get help.  He reports that he has a mild frontal headache.  Denies any other symptoms today.  No visual changes.  He has been waiting in the waiting room for about 10 hours with out complaint and says that his headache started after he had been in the waiting room for multiple hours and that was not why he came here today.   Chart review shows he has been seen for similar complaints before in September when he was connected with resources.     HPI  Past Medical History:  Diagnosis Date  . Diabetes mellitus   . Homelessness   . Hyperlipidemia   . Hypertension   . TBI (traumatic brain injury) (HCC)   . Whiplash    04/2012    Patient Active Problem List   Diagnosis Date Noted  . Diabetic retinopathy of both eyes (HCC) 09/17/2014  . Midline low back pain with right-sided sciatica 09/17/2014  . Hyperglycemia 05/27/2014  . Carbuncle of leg, right 03/26/2014  . Onychomycosis 03/26/2014  . 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 Essential hypertension 12/20/2013  . DM (diabetes mellitus) (HCC) 09/19/2013  . Left knee pain 09/19/2013  . Acute CVA (cerebrovascular accident) (HCC) 02/28/2013  . HTN (hypertension) with goal to be determined 02/28/2013  . Diabetes (HCC) 02/28/2013    Past Surgical History:  Procedure Laterality Date  . APPENDECTOMY         Home Medications    Prior to Admission medications   Medication Sig Start Date End Date Taking? Authorizing Provider  lisinopril-hydrochlorothiazide (ZESTORETIC) 20-12.5 MG tablet Take 1 tablet daily by  mouth. 05/14/17   Cristina GongHammond, Mckynlee Luse W, PA-C  metFORMIN (GLUCOPHAGE) 500 MG tablet Take 1 tablet (500 mg total) 2 (two) times daily with a meal by mouth. 05/14/17   Cristina GongHammond, Valisha Heslin W, PA-C    Family History Family History  Problem Relation Age of Onset  . Diabetes Mother   . Diabetes Brother   . Hypertension Maternal Uncle     Social History Social History   Tobacco Use  . Smoking status: Never Smoker  . Smokeless tobacco: Never Used  Substance Use Topics  . Alcohol use: Yes  . Drug use: No     Allergies   Penicillins   Review of Systems Review of Systems  Constitutional: Negative for chills and fever.  HENT: Negative for ear pain and sore throat.   Eyes: Negative for photophobia, pain and visual disturbance.  Respiratory: Negative for cough and shortness of breath.   Cardiovascular: Negative for chest pain and palpitations.  Gastrointestinal: Negative for abdominal pain, nausea and vomiting.  Genitourinary: Negative for dysuria and hematuria.  Musculoskeletal: Negative for arthralgias, back pain, neck pain and neck stiffness.  Skin: Negative for color change and rash.  Neurological: Positive for headaches. Negative for dizziness, seizures, syncope, weakness and numbness.  All other systems reviewed and are negative.    Physical Exam Updated Vital Signs BP (!) 182/104 (BP Location: Left Arm)   Pulse 96   Temp 98.4 F (36.9 C) (Oral)  Resp 16   Ht 6\' 2"  (1.88 m)   Wt 90.7 kg (200 lb)   SpO2 100%   BMI 25.68 kg/m   Physical Exam  Constitutional: He is oriented to person, place, and time. He appears well-developed and well-nourished. No distress.  HENT:  Head: Normocephalic and atraumatic.  Mouth/Throat: Oropharynx is clear and moist.  Eyes: Conjunctivae are normal.  Neck: Normal range of motion. Neck supple. No JVD present.  Cardiovascular: Normal rate and regular rhythm.  No murmur heard. Pulmonary/Chest: Effort normal and breath sounds normal. No  respiratory distress.  Abdominal: Soft. Bowel sounds are normal. He exhibits no distension. There is no tenderness.  Musculoskeletal: He exhibits no edema.  Lymphadenopathy:    He has no cervical adenopathy.  Neurological: He is alert and oriented to person, place, and time.  Mental Status:  Alert, oriented to person, place and year. Speech fluent without evidence of aphasia.  Slow to answer questions and requires prompting which appears consistent with his baseline per chart review.  Cranial Nerves:  II:  Peripheral visual fields grossly normal, pupils equal, round, reactive to light III,IV, VI: ptosis not present, extra-ocular motions intact bilaterally  V,VII: smile symmetric, facial light touch sensation equal VIII: hearing grossly normal to voice  X: uvula elevates symmetrically  XI: bilateral shoulder shrug symmetric and strong XII: midline tongue extension without fassiculations Motor:  Normal tone. 5/5 in upper and lower extremities bilaterally including strong and equal grip strength and dorsiflexion/plantar flexion Cerebellar: normal finger-to-nose with bilateral upper extremities CV: distal pulses palpable throughout    Skin: Skin is warm and dry. He is not diaphoretic.  Psychiatric: He has a normal mood and affect. His behavior is normal.  Nursing note and vitals reviewed.    ED Treatments / Results  Labs (all labs ordered are listed, but only abnormal results are displayed) Labs Reviewed  CBG MONITORING, ED - Abnormal; Notable for the following components:      Result Value   Glucose-Capillary 240 (*)    All other components within normal limits    EKG  EKG Interpretation None       Radiology No results found.  Procedures Procedures (including critical care time)  Medications Ordered in ED Medications  acetaminophen (TYLENOL) tablet 650 mg (650 mg Oral Given 05/14/17 0749)  ibuprofen (ADVIL,MOTRIN) tablet 600 mg (600 mg Oral Given 05/14/17 1106)    lisinopril (PRINIVIL,ZESTRIL) tablet 20 mg (20 mg Oral Given 05/14/17 1147)  hydrochlorothiazide (MICROZIDE) capsule 12.5 mg (12.5 mg Oral Given 05/14/17 1107)  metFORMIN (GLUCOPHAGE) tablet 500 mg (500 mg Oral Given 05/14/17 1107)     Initial Impression / Assessment and Plan / ED Course  I have reviewed the triage vital signs and the nursing notes.  Pertinent labs & imaging results that were available during my care of the patient were reviewed by me and considered in my medical decision making (see chart for details).  Clinical Course as of May 14 1840  Sat May 14, 2017  0730 No TTS consult needed, TTS will get Christiane Ha, Child psychotherapist, to speak with patient.  [EH]  1043 Patient re-evaluated.  He says his head is still hurting and not changed.    [EH]  1050 Patient reevaluated, continues to plane of headache, says that his headache was initially getting better, however then he read the list of homeless shelters provided by social work and that made his head hurt more.  He says he does not feel like he can leave at  this time.  Will reorder blood pressure medicines, metformin, and ibuprofen.  [EH]  1236 Patient reevaluated, says his headache is much improved and he is ready to go at this time.  Will refill his blood pressure medicines and metformin.  [EH]    Clinical Course User Index [EH] Cristina GongHammond, Promyse Ardito W, PA-C   Vincent Osborne presents today requesting help with being homeless.  Chart review shows that he has been seen for this before and given resources.  He also reported a headache and says that he does not normally get headaches.  At that time I saw him he had been in the waiting room for approximately 10 hours.  He was given breakfast, caffeine, and Tylenol.  Shows that he has been seen here for headaches before so I suspect that this is a recurrent issue.  Neurologically he is intact, and I do not feel like a CT scan of his head is warranted at this time.  His headache initially  improved after breakfast and caffeine than when I went to discharge him says that it was hurting again after he spoke with social work.  I reordered his blood pressure medicine, metformin, and his ibuprofen.  This resolved his headache and that he felt ready to leave.  Social work was consulted who provided patient with a list of resources.  I provided patient with a list of shelters, resource guide for adult outpatient counseling/substance abuse, resource guide for financial assistance, and information about headaches.  I also refilled his lisinopril/HCTZ and his metformin for him today as he does not appear to have been taking these medicines.  IV fluids were considered for his headache, however he is able to take in adequate p.o. fluids and I feel like p.o.  hydration is most appropriate at this time.    This patient was seen as a shared visit with Dr. Erma HeritageIsaacs who evaluated the patient and agreed with my plan.  Patient remained hemodynamically stable while in the emergency room.  Patient  appears safe for discharge at this time.  Final Clinical Impressions(s) / ED Diagnoses   Final diagnoses:  Homelessness  Nonintractable headache, unspecified chronicity pattern, unspecified headache type  Hypertension, unspecified type    ED Discharge Orders        Ordered    lisinopril-hydrochlorothiazide (ZESTORETIC) 20-12.5 MG tablet  Daily     05/14/17 1239    metFORMIN (GLUCOPHAGE) 500 MG tablet  2 times daily with meals     05/14/17 1239       Cristina GongHammond, Irania Durell W, [place] JerseyPA-C 05/14/17 1851    Shaune PollackIsaacs, Cameron, MD 05/14/17 2059

## 2017-05-14 NOTE — ED Notes (Signed)
Patient given food and drink per PA.

## 2017-05-14 NOTE — ED Notes (Signed)
Pt sleeping in lobby 

## 2017-05-15 ENCOUNTER — Other Ambulatory Visit: Payer: Self-pay

## 2017-05-15 ENCOUNTER — Encounter (HOSPITAL_COMMUNITY): Payer: Self-pay | Admitting: Emergency Medicine

## 2017-05-15 DIAGNOSIS — E11319 Type 2 diabetes mellitus with unspecified diabetic retinopathy without macular edema: Secondary | ICD-10-CM | POA: Insufficient documentation

## 2017-05-15 DIAGNOSIS — Z7984 Long term (current) use of oral hypoglycemic drugs: Secondary | ICD-10-CM | POA: Insufficient documentation

## 2017-05-15 DIAGNOSIS — R413 Other amnesia: Secondary | ICD-10-CM | POA: Insufficient documentation

## 2017-05-15 DIAGNOSIS — I10 Essential (primary) hypertension: Secondary | ICD-10-CM | POA: Insufficient documentation

## 2017-05-15 LAB — COMPREHENSIVE METABOLIC PANEL
ALBUMIN: 3.2 g/dL — AB (ref 3.5–5.0)
ALK PHOS: 59 U/L (ref 38–126)
ALT: 12 U/L — ABNORMAL LOW (ref 17–63)
ANION GAP: 7 (ref 5–15)
AST: 16 U/L (ref 15–41)
BUN: 20 mg/dL (ref 6–20)
CO2: 26 mmol/L (ref 22–32)
Calcium: 9.1 mg/dL (ref 8.9–10.3)
Chloride: 104 mmol/L (ref 101–111)
Creatinine, Ser: 1.63 mg/dL — ABNORMAL HIGH (ref 0.61–1.24)
GFR calc Af Amer: 54 mL/min — ABNORMAL LOW (ref >60)
GFR calc non Af Amer: 47 mL/min — ABNORMAL LOW (ref >60)
GLUCOSE: 227 mg/dL — AB (ref 65–99)
POTASSIUM: 3.4 mmol/L — AB (ref 3.5–5.1)
SODIUM: 137 mmol/L (ref 135–145)
Total Bilirubin: 0.4 mg/dL (ref 0.3–1.2)
Total Protein: 6.4 g/dL — ABNORMAL LOW (ref 6.5–8.1)

## 2017-05-15 LAB — CBC
HEMATOCRIT: 33.7 % — AB (ref 39.0–52.0)
HEMOGLOBIN: 10.9 g/dL — AB (ref 13.0–17.0)
MCH: 26.8 pg (ref 26.0–34.0)
MCHC: 32.3 g/dL (ref 30.0–36.0)
MCV: 83 fL (ref 78.0–100.0)
Platelets: 219 10*3/uL (ref 150–400)
RBC: 4.06 MIL/uL — ABNORMAL LOW (ref 4.22–5.81)
RDW: 13.1 % (ref 11.5–15.5)
WBC: 8.6 10*3/uL (ref 4.0–10.5)

## 2017-05-15 NOTE — ED Triage Notes (Signed)
Brought by [employer].  Patient reports that he requested to come here because he couldn't answer their questions.  Able to answer some questions.  [facility] and [facility].

## 2017-05-16 ENCOUNTER — Emergency Department (HOSPITAL_COMMUNITY)
Admission: EM | Admit: 2017-05-16 | Discharge: 2017-05-16 | Disposition: A | Discharge status: Home / Self Care | Payer: No Typology Code available for payment source | Attending: Emergency Medicine | Admitting: Emergency Medicine

## 2017-05-16 DIAGNOSIS — R6889 Other general symptoms and signs: Secondary | ICD-10-CM

## 2017-05-16 NOTE — Discharge Instructions (Signed)
Your exam is reassuring that you are not having a stroke

## 2017-05-16 NOTE — ED Provider Notes (Signed)
[facility]   CSN: [id number] Arrival date & time: 05/15/17  2034     History   Chief Complaint Chief Complaint  Patient presents with  . Altered Mental Status    HPI Vincent Osborne is a 51 y.o. male.  Patient with history of DM, HTN, HLD, CVA (2014), diabetic retinopathy, TBI presents for concern of poor memory. He was talking with [employer] prior to arrival and could not answer their questions and, so, was transported to the ER. Here he is asked questions in history and responds "I can't remember". He reports he was seen recently in the emergency room but "at the other hospital, I can't remember the name". He denies chest pain, SOB, DOE, headache, nausea, vomiting. No falls.     Altered Mental Status   Pertinent negatives include no weakness.    Past Medical History:  Diagnosis Date  . Diabetes mellitus   . Homelessness   . Hyperlipidemia   . Hypertension   . TBI (traumatic brain injury) (HCC)   . Whiplash    04/2012    Patient Active Problem List   Diagnosis Date Noted  . Diabetic retinopathy of both eyes (HCC) 09/17/2014  . Midline low back pain with right-sided sciatica 09/17/2014  . Hyperglycemia 05/27/2014  . Carbuncle of leg, right 03/26/2014  . Onychomycosis 03/26/2014  . 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 Essential hypertension 12/20/2013  . DM (diabetes mellitus) (HCC) 09/19/2013  . Left knee pain 09/19/2013  . Acute CVA (cerebrovascular accident) (HCC) 02/28/2013  . HTN (hypertension) with goal to be determined 02/28/2013  . Diabetes (HCC) 02/28/2013    Past Surgical History:  Procedure Laterality Date  . APPENDECTOMY         Home Medications    Prior to Admission medications   Medication Sig Start Date End Date Taking? Authorizing Provider  lisinopril-hydrochlorothiazide (ZESTORETIC) 20-12.5 MG tablet Take 1 tablet daily by mouth. 05/14/17   Cristina GongHammond, Elizabeth W,  PA-C  metFORMIN (GLUCOPHAGE) 500 MG tablet Take 1 tablet (500 mg total) 2 (two) times daily with a meal by mouth. 05/14/17   Cristina GongHammond, Elizabeth W, PA-C    Family History Family History  Problem Relation Age of Onset  . Diabetes Mother   . Diabetes Brother   . Hypertension Maternal Uncle     Social History Social History   Tobacco Use  . Smoking status: Never Smoker  . Smokeless tobacco: Never Used  Substance Use Topics  . Alcohol use: Yes  . Drug use: No     Allergies   Penicillins   Review of Systems Review of Systems  Constitutional: Negative for chills and fever.  HENT: Negative.   Respiratory: Negative.  Negative for shortness of breath.   Cardiovascular: Negative.  Negative for chest pain.  Gastrointestinal: Negative.  Negative for abdominal pain, nausea and vomiting.  Musculoskeletal: Negative.   Skin: Negative.   Neurological: Negative for syncope, weakness, numbness and headaches.       Memory difficulty     Physical Exam Updated Vital Signs BP (!) 145/102   Pulse 97   Temp 98.5 F (36.9 C) (Oral)   Resp 16   Ht 6\' 2"  (1.88 m)   Wt 90.7 kg (200 lb)   SpO2 100%   BMI 25.68 kg/m   Physical Exam  Constitutional: He is oriented to person, place, and time. He appears well-developed and well-nourished.  HENT:  Head: Normocephalic.  Eyes: Pupils are equal, round, and  reactive to light.  Neck: Normal range of motion. Neck supple.  Cardiovascular: Normal rate and regular rhythm.  No murmur heard. Pulmonary/Chest: Effort normal and breath sounds normal. He has no wheezes. He has no rales. He exhibits no tenderness.  Abdominal: Soft. Bowel sounds are normal. There is no tenderness. There is no rebound and no guarding.  Musculoskeletal: Normal range of motion. He exhibits no edema.  Neurological: He is alert and oriented to person, place, and time. A sensory deficit (He reports sensory deficit to bilateral feet "with my diabetes".) is present.  The  patient is alert and oriented. He follows commands. CN's 3-12 grossly intact. Speech is clear and focused. No facial asymmetry. No lateralizing weakness.No deficits of coordination. Ambulatory without imbalance. He is holding objects in both hands with normal strength and demonstrates intact fine motor coordination.   Skin: Skin is warm and dry. No rash noted.  Psychiatric: He has a normal mood and affect.     ED Treatments / Results  Labs (all labs ordered are listed, but only abnormal results are displayed) Labs Reviewed  COMPREHENSIVE METABOLIC PANEL - Abnormal; Notable for the following components:      Result Value   Potassium 3.4 (*)    Glucose, Bld 227 (*)    Creatinine, Ser 1.63 (*)    Total Protein 6.4 (*)    Albumin 3.2 (*)    ALT 12 (*)    GFR calc non Af Amer 47 (*)    GFR calc Af Amer 54 (*)    All other components within normal limits  CBC - Abnormal; Notable for the following components:   RBC 4.06 (*)    Hemoglobin 10.9 (*)    HCT 33.7 (*)    All other components within normal limits    EKG  EKG Interpretation None       Radiology No results found.  Procedures Procedures (including critical care time)  Medications Ordered in ED Medications - No data to display   Initial Impression / Assessment and Plan / ED Course  I have reviewed the triage vital signs and the nursing notes.  Pertinent labs & imaging results that were available during my care of the patient were reviewed by me and considered in my medical decision making (see chart for details).     The patient presents with complaint of decreased ability to recall details and poor memory. He reports stroke in the past. He denies weakness or new numbness. History of diabetic neuropathy of both feet.   On exam, there is no objective focal deficit. He is oriented. Has gross and fine motor capability. He follows command. He is ambulatory. No facial asymmetry.   Chart reviewed. Patient has a CVA  in 2014 where the presenting symptoms were left sided weakness and paresthesia. Do not feel there is recurrent CVA with normal neurologic exam tonight.   He has been given food and drink. He appears well, comfortable. He had multiple visits in August where homelessness was the primary issue. He is here today with a suitcase in hand and there is concern he is again homeless. Will provide resources for outpatient management.   Final Clinical Impressions(s) / ED Diagnoses   Final diagnoses:  None   1. Forgetfulness  ED Discharge Orders    None       Elpidio AnisUpstill, Tino Ronan, PA-C 05/16/17 [id number]    Zadie RhineWickline, Donald, MD 05/16/17 [phone]

## 2017-05-16 NOTE — Progress Notes (Signed)
CSW spoke with RN and provided pt with resources. CSW spoke to a representative from [facility] and was informed that at this time they do not have beds but will potentially have a bed after 1pm. [facility] and was informed that they would be willing to help pt once Case Managers are return. At this time there are no further CSW needs. CSW signing.    Claude MangesKierra S. Teagen Mcleary, MSW, LCSW-A Emergency Department Clinical Social Worker [phone]

## 2017-05-16 NOTE — ED Notes (Addendum)
Spoke with SW who stated no available facilities for pt today, but that he can call [facility] on Tuesday and speak to the Case Managers there.  Notes written on pt discharge papers and pt walked to bus station with bus passes and is articulating where the bus will go.  He is alert and oriented at this time.

## 2017-05-16 NOTE — ED Notes (Signed)
Pt drinking hot chocolate.

## 2017-06-15 ENCOUNTER — Encounter (HOSPITAL_COMMUNITY): Payer: Self-pay | Admitting: *Deleted

## 2017-06-15 ENCOUNTER — Other Ambulatory Visit: Payer: Self-pay

## 2017-06-15 ENCOUNTER — Emergency Department (HOSPITAL_COMMUNITY)
Admission: EM | Admit: 2017-06-15 | Discharge: 2017-06-15 | Discharge status: Against Medical Advice | Payer: Self-pay | Attending: Emergency Medicine | Admitting: Emergency Medicine

## 2017-06-15 DIAGNOSIS — Z8673 Personal history of transient ischemic attack (TIA), and cerebral infarction without residual deficits: Secondary | ICD-10-CM | POA: Insufficient documentation

## 2017-06-15 DIAGNOSIS — Z79899 Other long term (current) drug therapy: Secondary | ICD-10-CM | POA: Insufficient documentation

## 2017-06-15 DIAGNOSIS — I10 Essential (primary) hypertension: Secondary | ICD-10-CM | POA: Insufficient documentation

## 2017-06-15 DIAGNOSIS — F1010 Alcohol abuse, uncomplicated: Secondary | ICD-10-CM | POA: Insufficient documentation

## 2017-06-15 DIAGNOSIS — E119 Type 2 diabetes mellitus without complications: Secondary | ICD-10-CM | POA: Insufficient documentation

## 2017-06-15 DIAGNOSIS — Z7984 Long term (current) use of oral hypoglycemic drugs: Secondary | ICD-10-CM | POA: Insufficient documentation

## 2017-06-15 LAB — COMPREHENSIVE METABOLIC PANEL
ALBUMIN: 2.4 g/dL — AB (ref 3.5–5.0)
ALK PHOS: 69 U/L (ref 38–126)
ALT: 12 U/L — ABNORMAL LOW (ref 17–63)
AST: 15 U/L (ref 15–41)
Anion gap: 6 (ref 5–15)
BILIRUBIN TOTAL: 0.4 mg/dL (ref 0.3–1.2)
BUN: 18 mg/dL (ref 6–20)
CALCIUM: 8.8 mg/dL — AB (ref 8.9–10.3)
CO2: 27 mmol/L (ref 22–32)
CREATININE: 1.79 mg/dL — AB (ref 0.61–1.24)
Chloride: 103 mmol/L (ref 101–111)
GFR calc Af Amer: 49 mL/min — ABNORMAL LOW (ref >60)
GFR, EST NON AFRICAN AMERICAN: 42 mL/min — AB (ref >60)
GLUCOSE: 367 mg/dL — AB (ref 65–99)
POTASSIUM: 3.9 mmol/L (ref 3.5–5.1)
Sodium: 136 mmol/L (ref 135–145)
TOTAL PROTEIN: 5.8 g/dL — AB (ref 6.5–8.1)

## 2017-06-15 LAB — RAPID URINE DRUG SCREEN, HOSP PERFORMED
Amphetamines: NOT DETECTED
Barbiturates: NOT DETECTED
Benzodiazepines: NOT DETECTED
COCAINE: NOT DETECTED
OPIATES: NOT DETECTED
TETRAHYDROCANNABINOL: NOT DETECTED

## 2017-06-15 LAB — CBC
HCT: 32.5 % — ABNORMAL LOW (ref 39.0–52.0)
Hemoglobin: 10.4 g/dL — ABNORMAL LOW (ref 13.0–17.0)
MCH: 26.7 pg (ref 26.0–34.0)
MCHC: 32 g/dL (ref 30.0–36.0)
MCV: 83.5 fL (ref 78.0–100.0)
PLATELETS: 188 10*3/uL (ref 150–400)
RBC: 3.89 MIL/uL — ABNORMAL LOW (ref 4.22–5.81)
RDW: 13.5 % (ref 11.5–15.5)
WBC: 7.4 10*3/uL (ref 4.0–10.5)

## 2017-06-15 LAB — ETHANOL

## 2017-06-15 MED ORDER — LISINOPRIL-HYDROCHLOROTHIAZIDE 20-12.5 MG PO TABS: 1.0000 | ORAL_TABLET | Freq: Every day | ORAL | 0 refills | Status: AC

## 2017-06-15 MED ORDER — CHLORDIAZEPOXIDE HCL 25 MG PO CAPS: ORAL_CAPSULE | ORAL | 0 refills | Status: DC

## 2017-06-15 NOTE — ED Notes (Signed)
Please move OTF.  

## 2017-06-15 NOTE — ED Notes (Signed)
Pt called for vitals multiple times with no response. 

## 2017-06-15 NOTE — ED Triage Notes (Signed)
The pt reports that he is here because he drank alcohol all day yesterday  He called a case worker who told him to come here ????  He reports that he is drinking because he wants to go back to the beach and his girlfriend is there

## 2017-06-15 NOTE — ED Notes (Signed)
Pt reports that he is here for detox from alcohol.

## 2017-06-15 NOTE — ED Notes (Signed)
No answer at 19:50 when called for vitals.

## 2017-06-15 NOTE — ED Provider Notes (Signed)
[facility] Provider Note   CSN: [id number] Arrival date & time: 06/15/17  1616     History   Chief Complaint Chief Complaint  Patient presents with  . Alcohol Problem    HPI Alvan Damearon Offord is a 51 y.o. male.  The history is provided by the patient and medical records.  Alcohol Problem     51 year old male with history of diabetes, hyperlipidemia, hypertension, history of TBI, homelessness, presenting to the ED requesting detox from alcohol.  Patient reports he has been sad lately because he is away from his girlfriend and the holidays are coming.  States he has been drinking a lot over the past few days, cannot quantify specifically how much.  States he does not have long-standing history of alcohol abuse.  He denies any illicit drug use.  He denies any suicidal or homicidal thoughts at this time.  No hallucinations.  States he has tried to go to detox before but does not have adequate means in which to follow-up with this.  Denies any chest pain, sob, abdominal pain, N/V/D, etc.  Past Medical History:  Diagnosis Date  . Diabetes mellitus   . Homelessness   . Hyperlipidemia   . Hypertension   . TBI (traumatic brain injury) (HCC)   . Whiplash    04/2012    Patient Active Problem List   Diagnosis Date Noted  . Diabetic retinopathy of both eyes (HCC) 09/17/2014  . Midline low back pain with right-sided sciatica 09/17/2014  . Hyperglycemia 05/27/2014  . Carbuncle of leg, right 03/26/2014  . Onychomycosis 03/26/2014  . 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 Essential hypertension 12/20/2013  . DM (diabetes mellitus) (HCC) 09/19/2013  . Left knee pain 09/19/2013  . Acute CVA (cerebrovascular accident) (HCC) 02/28/2013  . HTN (hypertension) with goal to be determined 02/28/2013  . Diabetes (HCC) 02/28/2013    Past Surgical History:  Procedure Laterality Date  . APPENDECTOMY         Home Medications      Prior to Admission medications   Medication Sig Start Date End Date Taking? Authorizing Provider  lisinopril-hydrochlorothiazide (ZESTORETIC) 20-12.5 MG tablet Take 1 tablet daily by mouth. 05/14/17   Cristina GongHammond, Elizabeth W, PA-C  metFORMIN (GLUCOPHAGE) 500 MG tablet Take 1 tablet (500 mg total) 2 (two) times daily with a meal by mouth. 05/14/17   Cristina GongHammond, Elizabeth W, PA-C    Family History Family History  Problem Relation Age of Onset  . Diabetes Mother   . Diabetes Brother   . Hypertension Maternal Uncle     Social History Social History   Tobacco Use  . Smoking status: Never Smoker  . Smokeless tobacco: Never Used  Substance Use Topics  . Alcohol use: Yes  . Drug use: No     Allergies   Penicillins   Review of Systems Review of Systems  Psychiatric/Behavioral:       Wants detox  All other systems reviewed and are negative.    Physical Exam Updated Vital Signs BP (!) 188/107   Pulse 98   Temp 99.5 F (37.5 C)   Resp 18   Ht 6\' 2"  (1.88 m)   Wt 90.7 kg (200 lb)   SpO2 100%   BMI 25.68 kg/m   Physical Exam  Constitutional: He is oriented to person, place, and time. He appears well-developed and well-nourished.  Sleeping on stretcher, when awoken is AAOx3  HENT:  Head: Normocephalic and atraumatic.  Mouth/Throat: Oropharynx is clear  and moist.  Eyes: Conjunctivae and EOM are normal. Pupils are equal, round, and reactive to light.  Neck: Normal range of motion.  Cardiovascular: Normal rate, regular rhythm and normal heart sounds.  Pulmonary/Chest: Effort normal and breath sounds normal. No stridor. No respiratory distress.  Abdominal: Soft. Bowel sounds are normal.  Musculoskeletal: Normal range of motion.  Neurological: He is alert and oriented to person, place, and time.  No tremors or seizure activity  Skin: Skin is warm and dry.  Psychiatric: He has a normal mood and affect.  Nursing note and vitals reviewed.    ED Treatments / Results   Labs (all labs ordered are listed, but only abnormal results are displayed) Labs Reviewed  COMPREHENSIVE METABOLIC PANEL - Abnormal; Notable for the following components:      Result Value   Glucose, Bld 367 (*)    Creatinine, Ser 1.79 (*)    Calcium 8.8 (*)    Total Protein 5.8 (*)    Albumin 2.4 (*)    ALT 12 (*)    GFR calc non Af Amer 42 (*)    GFR calc Af Amer 49 (*)    All other components within normal limits  CBC - Abnormal; Notable for the following components:   RBC 3.89 (*)    Hemoglobin 10.4 (*)    HCT 32.5 (*)    All other components within normal limits  ETHANOL  RAPID URINE DRUG SCREEN, HOSP PERFORMED    EKG  EKG Interpretation None       Radiology No results found.  Procedures Procedures (including critical care time)  Medications Ordered in ED Medications - No data to display   Initial Impression / Assessment and Plan / ED Course  I have reviewed the triage vital signs and the nursing notes.  Pertinent labs & imaging results that were available during my care of the patient were reviewed by me and considered in my medical decision making (see chart for details).  51 year old male presenting to the ED requesting alcohol detox.  States he has been drinking a lot recently as he is separated from his girlfriend around the holidays.  Cannot quantify specifically how much.  Denies illicit drug use.  Patient is sleeping on the stretcher when I entered the room but once awoken is awake, alert, and fully oriented.  He does not have any tremors or seizure activity to suggest acute withdrawal.  Screening lab work is overall reassuring.  Creatinine is elevated but remains around his baseline.  He is also hypertensive, has not been on his medications recently.  He does not have any signs of endorgan damage today and is asymptomatic of this-- no chest pain, SOB, headaches, dizziness, etc.  We will restart his home blood pressure medications, Librium taper, and give  referrals for outpatient detox centers.  Discussed plan with patient, he acknowledged understanding and agreed with plan of care.  Return precautions given for new or worsening symptoms.  Final Clinical Impressions(s) / ED Diagnoses   Final diagnoses:  Alcohol abuse    ED Discharge Orders    None       Garlon HatchetSanders, Haliey Romberg M, PA-C 06/15/17 2333    Tegeler, Canary Brimhristopher J, MD 06/16/17 [phone]

## 2017-06-15 NOTE — Discharge Instructions (Signed)
Re-start your blood pressure medication at home. Can use the librium to help you detox. Follow-up with one of the detox centers. Return here for new concerns.

## 2017-06-15 NOTE — ED Notes (Signed)
Pt is HERE but is asleep in the blue triage chairs (facing the TV; black jacket and red pants on; package of medications with him on the chair beside him).

## 2017-07-08 ENCOUNTER — Encounter: Payer: Self-pay | Admitting: Pediatric Intensive Care

## 2017-07-08 DIAGNOSIS — E118 Type 2 diabetes mellitus with unspecified complications: Secondary | ICD-10-CM

## 2017-07-08 LAB — GLUCOSE, POCT (MANUAL RESULT ENTRY): POC Glucose: 333 mg/dl — AB (ref 70–99)

## 2017-07-08 NOTE — Congregational Nurse Program (Signed)
Congregational Nurse Program Note  Date of Encounter: 07/04/2017  Past Medical History: Past Medical History:  Diagnosis Date  . Diabetes mellitus   . Homelessness   . Hyperlipidemia   . Hypertension   . TBI (traumatic brain injury) (HCC)   . Whiplash    04/2012    Encounter Details: CNP Questionnaire - 07/04/17 1140      Questionnaire   Patient Status  Not Applicable    Race  Black or African American    Location Patient Served At  Not Applicable    Insurance  Not Applicable    Uninsured  Uninsured (NEW 1x/quarter)    Food  Yes, have food insecurities;Within past 12 months, worried food would run out with no money to buy more;Within past 12 months, food ran out with no money to buy more    Housing/Utilities  No permanent housing    Transportation  Yes, need transportation assistance;Provided transportation assistance (bus pass, taxi voucher, etc.)    Interpersonal Safety  No, do not feel physically and emotionally safe where you currently live    Medication  Yes, have medication insecurities    Medical Provider  Yes    Referrals  Area Agency    ED Visit Averted  Yes    Life-Saving Intervention Made  Not Applicable      B/P 180/120.  States has taken his medications and does not know where they are.  Notified Lavinia SharpsMary Ann Placey NP (client's PHP).  Reported to her client's B/P.  She instructed for him to come see her ASAP and she will order his medications.  [facility]  Client is disoriented to person and time.  Short term memory significantly impaired.  Staff reported a "friend" dropped him off at the shelter, but no one knows who the individual is and client does not remember being dropped off

## 2017-07-08 NOTE — Congregational Nurse Program (Signed)
Congregational Nurse Program Note  Date of Encounter: 07/06/2017  Past Medical History: Past Medical History:  Diagnosis Date  . Diabetes mellitus   . Homelessness   . Hyperlipidemia   . Hypertension   . TBI (traumatic brain injury) (HCC)   . Whiplash    04/2012    Encounter Details: CNP Questionnaire - 07/06/17 1146      Questionnaire   Patient Status  Not Applicable    Race  Black or African American    Location Patient Served At  Not Applicable    Insurance  Not Applicable    Uninsured  Uninsured (NEW 1x/quarter)    Food  Yes, have food insecurities;Within past 12 months, worried food would run out with no money to buy more;Within past 12 months, food ran out with no money to buy more    Housing/Utilities  No permanent housing    Transportation  Yes, need transportation assistance;Provided transportation assistance (bus pass, taxi voucher, etc.)    Interpersonal Safety  No, do not feel physically and emotionally safe where you currently live    Medication  Yes, have medication insecurities    Medical Provider  Yes    Referrals  Area Agency    ED Visit Averted  Not Applicable    Life-Saving Intervention Made  Not Applicable      [facility]  Client came to see Ms. Placey NP (HCP).  Ms. Hilbert Corriganlacey [facility].  An individual escorting client will assist him to obtain medications from the [facility].

## 2017-07-11 ENCOUNTER — Encounter: Payer: Self-pay | Admitting: Pediatric Intensive Care

## 2017-07-11 DIAGNOSIS — E118 Type 2 diabetes mellitus with unspecified complications: Secondary | ICD-10-CM

## 2017-07-11 LAB — GLUCOSE, POCT (MANUAL RESULT ENTRY): POC Glucose: 272 mg/dl — AB (ref 70–99)

## 2017-07-12 ENCOUNTER — Encounter: Payer: Self-pay | Admitting: Pediatric Intensive Care

## 2017-07-18 ENCOUNTER — Encounter: Payer: Self-pay | Admitting: Pediatric Intensive Care

## 2017-07-18 DIAGNOSIS — E118 Type 2 diabetes mellitus with unspecified complications: Secondary | ICD-10-CM

## 2017-07-18 LAB — GLUCOSE, POCT (MANUAL RESULT ENTRY): POC GLUCOSE: 172 mg/dL — AB (ref 70–99)

## 2017-07-22 ENCOUNTER — Encounter: Payer: Self-pay | Admitting: Pediatric Intensive Care

## 2017-07-22 DIAGNOSIS — E118 Type 2 diabetes mellitus with unspecified complications: Secondary | ICD-10-CM

## 2017-07-22 LAB — GLUCOSE, POCT (MANUAL RESULT ENTRY): POC GLUCOSE: 201 mg/dL — AB (ref 70–99)

## 2017-07-27 ENCOUNTER — Emergency Department (HOSPITAL_COMMUNITY)
Admission: EM | Admit: 2017-07-27 | Discharge: 2017-07-28 | Disposition: A | Discharge status: Home / Self Care | Payer: Self-pay | Attending: Emergency Medicine | Admitting: Emergency Medicine

## 2017-07-27 ENCOUNTER — Encounter (HOSPITAL_COMMUNITY): Payer: Self-pay

## 2017-07-27 DIAGNOSIS — D649 Anemia, unspecified: Secondary | ICD-10-CM | POA: Insufficient documentation

## 2017-07-27 DIAGNOSIS — N289 Disorder of kidney and ureter, unspecified: Secondary | ICD-10-CM | POA: Insufficient documentation

## 2017-07-27 DIAGNOSIS — R112 Nausea with vomiting, unspecified: Secondary | ICD-10-CM | POA: Insufficient documentation

## 2017-07-27 LAB — CBC WITH DIFFERENTIAL/PLATELET
BASOS ABS: 0 10*3/uL (ref 0.0–0.1)
Basophils Relative: 0 %
EOS ABS: 0.2 10*3/uL (ref 0.0–0.7)
Eosinophils Relative: 2 %
HCT: 34.1 % — ABNORMAL LOW (ref 39.0–52.0)
HEMOGLOBIN: 11.2 g/dL — AB (ref 13.0–17.0)
LYMPHS ABS: 1.6 10*3/uL (ref 0.7–4.0)
LYMPHS PCT: 19 %
MCH: 26.6 pg (ref 26.0–34.0)
MCHC: 32.8 g/dL (ref 30.0–36.0)
MCV: 81 fL (ref 78.0–100.0)
Monocytes Absolute: 1 10*3/uL (ref 0.1–1.0)
Monocytes Relative: 11 %
NEUTROS PCT: 68 %
Neutro Abs: 5.9 10*3/uL (ref 1.7–7.7)
Platelets: 245 10*3/uL (ref 150–400)
RBC: 4.21 MIL/uL — AB (ref 4.22–5.81)
RDW: 14 % (ref 11.5–15.5)
WBC: 8.7 10*3/uL (ref 4.0–10.5)

## 2017-07-27 LAB — COMPREHENSIVE METABOLIC PANEL
ALBUMIN: 2.1 g/dL — AB (ref 3.5–5.0)
ALT: 16 U/L — ABNORMAL LOW (ref 17–63)
ANION GAP: 7 (ref 5–15)
AST: 20 U/L (ref 15–41)
Alkaline Phosphatase: 94 U/L (ref 38–126)
BUN: 19 mg/dL (ref 6–20)
CO2: 24 mmol/L (ref 22–32)
Calcium: 9 mg/dL (ref 8.9–10.3)
Chloride: 107 mmol/L (ref 101–111)
Creatinine, Ser: 1.85 mg/dL — ABNORMAL HIGH (ref 0.61–1.24)
GFR calc Af Amer: 47 mL/min — ABNORMAL LOW (ref >60)
GFR calc non Af Amer: 40 mL/min — ABNORMAL LOW (ref >60)
Glucose, Bld: 241 mg/dL — ABNORMAL HIGH (ref 65–99)
POTASSIUM: 3.7 mmol/L (ref 3.5–5.1)
SODIUM: 138 mmol/L (ref 135–145)
Total Bilirubin: 0.3 mg/dL (ref 0.3–1.2)
Total Protein: 6.2 g/dL — ABNORMAL LOW (ref 6.5–8.1)

## 2017-07-27 LAB — ETHANOL: Alcohol, Ethyl (B): <10 mg/dL (ref <10)

## 2017-07-27 LAB — LIPASE, BLOOD: Lipase: 22 U/L (ref 11–51)

## 2017-07-27 LAB — I-STAT TROPONIN, ED: Troponin i, poc: 0.04 ng/mL (ref 0.00–0.08)

## 2017-07-27 LAB — CBG MONITORING, ED: GLUCOSE-CAPILLARY: 235 mg/dL — AB (ref 65–99)

## 2017-07-27 NOTE — ED Provider Notes (Signed)
[facility]-EMERGENCY DEPT Provider Note   CSN: [id number] Arrival date & time: 07/27/17  1925     History   Chief Complaint Chief Complaint  Patient presents with  . Nausea  . Emesis  . Hyperglycemia    HPI Vincent Osborne is a 52 y.o. male.  The history is provided by the patient.  He has history of diabetes, hypertension, hyperlipidemia, traumatic brain injury and homelessness and comes to the ED after having vomited twice today.  He continues to complain of mild nausea, but symptoms are much improved after receiving a dose of ondansetron in the ambulance coming to the ED.  He endorses subjective fever, but no chills or sweats.  He denies abdominal pain.  He has not been checking his blood sugars.  He claims compliance with his medications.  He denies alcohol or drug use.  Past Medical History:  Diagnosis Date  . Diabetes mellitus   . Homelessness   . Hyperlipidemia   . Hypertension   . TBI (traumatic brain injury) (HCC)   . Whiplash    04/2012    Patient Active Problem List   Diagnosis Date Noted  . Diabetic retinopathy of both eyes (HCC) 09/17/2014  . Midline low back pain with right-sided sciatica 09/17/2014  . Hyperglycemia 05/27/2014  . Carbuncle of leg, right 03/26/2014  . Onychomycosis 03/26/2014  . 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 Essential hypertension 12/20/2013  . DM (diabetes mellitus) (HCC) 09/19/2013  . Left knee pain 09/19/2013  . Acute CVA (cerebrovascular accident) (HCC) 02/28/2013  . HTN (hypertension) with goal to be determined 02/28/2013  . Diabetes (HCC) 02/28/2013    Past Surgical History:  Procedure Laterality Date  . APPENDECTOMY         Home Medications    Prior to Admission medications   Medication Sig Start Date End Date Taking? Authorizing Provider  lisinopril-hydrochlorothiazide (ZESTORETIC) 20-12.5 MG tablet Take 1 tablet by mouth daily. 06/15/17  Yes Garlon HatchetSanders, Lisa M, PA-C    metFORMIN (GLUCOPHAGE) 500 MG tablet Take 1 tablet (500 mg total) 2 (two) times daily with a meal by mouth. 05/14/17  Yes Cristina GongHammond, Elizabeth W, PA-C  chlordiazePOXIDE (LIBRIUM) 25 MG capsule 50mg  PO TID x 1D, then 25-50mg  PO BID X 1D, then 25-50mg  PO QD X 1D Patient not taking: Reported on 07/27/2017 06/15/17   Garlon HatchetSanders, Lisa M, PA-C    Family History Family History  Problem Relation Age of Onset  . Diabetes Mother   . Diabetes Brother   . Hypertension Maternal Uncle     Social History Social History   Tobacco Use  . Smoking status: Never Smoker  . Smokeless tobacco: Never Used  Substance Use Topics  . Alcohol use: Yes  . Drug use: No     Allergies   Penicillins   Review of Systems Review of Systems  All other systems reviewed and are negative.    Physical Exam Updated Vital Signs BP (!) 194/124 (BP Location: Right Arm)   Pulse (!) 123   Resp 20   Ht 6\' 2"  (1.88 m)   Wt 90.7 kg (200 lb)   SpO2 96%   BMI 25.68 kg/m   Physical Exam  Nursing note and vitals reviewed.  52 year old male, resting comfortably and in no acute distress. Vital signs are significant for hypertension and tachycardia. Oxygen saturation is 96%, which is normal. Head is normocephalic and atraumatic. PERRLA, EOMI. Oropharynx is clear. Neck is nontender and supple without adenopathy or JVD.  Back is nontender and there is no CVA tenderness. Lungs are clear without rales, wheezes, or rhonchi. Chest is nontender. Heart has regular rate and rhythm without murmur. Abdomen is soft, flat, nontender without masses or hepatosplenomegaly and peristalsis is normoactive. Extremities have 2+ pretibial edema and 1-2+ presacral edema, full range of motion is present.  Mild venous stasis changes are present. Skin is warm and dry without rash. Neurologic: Mental status is normal, cranial nerves are intact, there are no motor or sensory deficits.  ED Treatments / Results  Labs (all labs ordered are  listed, but only abnormal results are displayed) Labs Reviewed  COMPREHENSIVE METABOLIC PANEL - Abnormal; Notable for the following components:      Result Value   Glucose, Bld 241 (*)    Creatinine, Ser 1.85 (*)    Total Protein 6.2 (*)    Albumin 2.1 (*)    ALT 16 (*)    GFR calc non Af Amer 40 (*)    GFR calc Af Amer 47 (*)    All other components within normal limits  CBC WITH DIFFERENTIAL/PLATELET - Abnormal; Notable for the following components:   RBC 4.21 (*)    Hemoglobin 11.2 (*)    HCT 34.1 (*)    All other components within normal limits  URINALYSIS, ROUTINE W REFLEX MICROSCOPIC - Abnormal; Notable for the following components:   Glucose, UA >=500 (*)    Hgb urine dipstick SMALL (*)    Ketones, ur 20 (*)    Protein, ur >=300 (*)    Squamous Epithelial / LPF 0-5 (*)    All other components within normal limits  CBG MONITORING, ED - Abnormal; Notable for the following components:   Glucose-Capillary 235 (*)    All other components within normal limits  LIPASE, BLOOD  ETHANOL  I-STAT TROPONIN, ED    Procedures Procedures (including critical care time)  Medications Ordered in ED Medications - No data to display   Initial Impression / Assessment and Plan / ED Course  I have reviewed the triage vital signs and the nursing notes.  Pertinent labs & imaging results that were available during my care of the patient were reviewed by me and considered in my medical decision making (see chart for details).  Nausea and vomiting without any red flags to suggest serious pathology.  Old records are reviewed, and he has multiple ED visits related to homelessness, hypertension, alcohol abuse.  Because of presence of edema, I am going to try to avoid excessive hydration.  Screening labs are obtained.  Curiously, on review of old records, multiple visits mention no edema present.  Following above-noted treatment, patient is resting comfortably.  Laboratory workup is reassuring.   He has an elevated creatinine and mild anemia which are both unchanged from baseline. however, he continues to be tachycardic so we will give IV fluids.  Case is signed out to Dr. Lynelle Doctor to reevaluate response of heart rate.  Final Clinical Impressions(s) / ED Diagnoses   Final diagnoses:  Non-intractable vomiting with nausea, unspecified vomiting type  Renal insufficiency  Normochromic normocytic anemia    ED Discharge Orders        Ordered    ondansetron (ZOFRAN) 4 MG tablet  Every 6 hours PRN     07/28/17 0042       Dione Booze, MD 07/28/17 [phone]

## 2017-07-27 NOTE — ED Notes (Signed)
Bed: [id number] Expected date:  Expected time:  Means of arrival:  Comments: 52 yr old nausea vomiting

## 2017-07-27 NOTE — ED Triage Notes (Signed)
Pt arrived via [facility] from homeless [facility] with complaints of N/V. Pt given 4 of zofran en route.

## 2017-07-27 NOTE — ED Notes (Signed)
Attempted too get urine sample patient notified me that someone else had already assisted him too bathroom but did not obtain a urine sample.

## 2017-07-28 LAB — URINALYSIS, ROUTINE W REFLEX MICROSCOPIC
Bacteria, UA: NONE SEEN
Bilirubin Urine: NEGATIVE
Glucose, UA: >=500 mg/dL — AB
Ketones, ur: 20 mg/dL — AB
LEUKOCYTES UA: NEGATIVE
Nitrite: NEGATIVE
SPECIFIC GRAVITY, URINE: 1.017 (ref 1.005–1.030)
pH: 6 (ref 5.0–8.0)

## 2017-07-28 LAB — CBG MONITORING, ED: Glucose-Capillary: 150 mg/dL — ABNORMAL HIGH (ref 65–99)

## 2017-07-28 MED ORDER — SODIUM CHLORIDE 0.9 % IV BOLUS (SEPSIS)
1000.0000 mL | Freq: Once | INTRAVENOUS | Status: AC
  Administered 2017-07-28: 1000 mL via INTRAVENOUS

## 2017-07-28 MED ORDER — LABETALOL HCL 5 MG/ML IV SOLN
10.0000 mg | Freq: Once | INTRAVENOUS | Status: AC
  Administered 2017-07-28: 10 mg via INTRAVENOUS
  Filled 2017-07-28: qty 4

## 2017-07-28 MED ORDER — ONDANSETRON HCL 4 MG PO TABS: 4.0000 mg | ORAL_TABLET | Freq: Four times a day (QID) | ORAL | 0 refills | Status: AC | PRN

## 2017-07-28 NOTE — ED Provider Notes (Signed)
2:45 AM patient is sleeping, when I enter the room and look at the monitor his heart rate is still 115.  Will give second liter of IV fluids.  Patient was having persistent hypertension, he was given labetalol for his hypertension.  7:30 AM patient still has a persistent tachycardia.  His blood pressure however has improved to 147/103.  He has gotten 3 L of IV fluids.  We will repeat CBG.  Please note that whenever I walked by the room patient has been sleeping in no distress.  8 AM Dr. Jeraldine LootsLockwood will follow up on his CBG.   Devoria AlbeKnapp, Sabina Beavers, MD 07/28/17 [phone]

## 2017-07-28 NOTE — ED Provider Notes (Signed)
9:50 AM Patient awake alert, hungry, no complaints, specifically no chest pain, no dyspnea, no palpitations, no abdominal pain, and the patient states that he is hungry and thirsty. Patient acknowledges taking multiple medication, but is unsure of what they are. Patient has a mild persistent tachycardia, but no evidence for decompensated state, and with no complaints, there is low suspicion for occult PE, ACS, or other acute pathology given the passage of time since his initial evaluation, and reassuring findings beyond mild tachycardia. Hyperglycemia is essentially resolved as well. Patient discharged in stable condition to follow-up with his primary care team for consideration of medication adjustments, given some suspicion for inconsistent use of beta-blocker medication, with rebound tachycardia.   Vincent Osborne, Vincent Bunda, MD 07/28/17 [phone]

## 2017-07-28 NOTE — ED Notes (Signed)
Writer attempted to collect urine from pt.

## 2017-08-07 NOTE — Congregational Nurse Program (Signed)
Congregational Nurse Program Note  Date of Encounter: 07/11/2017  Past Medical History: Past Medical History:  Diagnosis Date  . Diabetes mellitus   . Homelessness   . Hyperlipidemia   . Hypertension   . TBI (traumatic brain injury) (HCC)   . Whiplash    04/2012    Encounter Details: CNP Questionnaire - 07/11/17 0900      Questionnaire   Patient Status  Not Applicable    Race  Black or African American    [facility]    Insurance  Not Applicable    Uninsured  Uninsured (Subsequent visits/quarter)    Food  Within past 12 months, worried food would run out with no money to buy more;Yes, have food insecurities    Housing/Utilities  No permanent housing    Transportation  Yes, need transportation assistance    Interpersonal Safety  No, do not feel physically and emotionally safe where you currently live    Medication  Yes, have medication insecurities    Medical Provider  Yes    Referrals  Primary Care Provider/Clinic    ED Visit Averted  Not Applicable    Life-Saving Intervention Made  Not Applicable     BP/BG re-check. Client took medication this morning. Will return to clinic in AM for re-check.

## 2017-08-07 NOTE — Congregational Nurse Program (Signed)
Congregational Nurse Program Note  Date of Encounter: 07/08/2017  Past Medical History: Past Medical History:  Diagnosis Date  . Diabetes mellitus   . Homelessness   . Hyperlipidemia   . Hypertension   . TBI (traumatic brain injury) ([place])   . Whiplash    04/2012    Encounter Details: Client referred by Joelene Millin CN- states that he has not been taking his medication. Client states he does not recall his appointment at [facility] or meeting with C Evans. Client denies headache, visual changes and does not appear to have any gross motor weakness. Cleint states that he doesn't remember how he came to [place] or where he lived before he was homeless but states that he grew up on [address]. This CN met the client in September and he appears to be at the same cognitive baseline as initial encounter. Client will take his medication (his medication has been prepped in a pill container) and CN will re-check BP/BG in AM. Client agrees to plan.

## 2017-08-10 NOTE — Congregational Nurse Program (Signed)
Congregational Nurse Program Note  Date of Encounter: 07/12/2017  Past Medical History: Past Medical History:  Diagnosis Date  . Diabetes mellitus   . Homelessness   . Hyperlipidemia   . Hypertension   . TBI (traumatic brain injury) (HCC)   . Whiplash    04/2012    Encounter Details: CNP Questionnaire - 07/12/17 0830      Questionnaire   Patient Status  Not Applicable    Race  Black or African American    [facility]    Insurance  Not Applicable    Uninsured  Uninsured (NEW 1x/quarter)    Food  Within past 12 months, worried food would run out with no money to buy more    Housing/Utilities  No permanent housing    Transportation  Yes, need transportation assistance    Interpersonal Safety  No, do not feel physically and emotionally safe where you currently live    Medication  Yes, have medication insecurities    Medical Provider  Yes    Referrals  Primary Care Provider/Clinic    ED Visit Averted  Not Applicable    Life-Saving Intervention Made  Not Applicable      Client appears at his cognitive baseline. He recalls what he had for breakfast this morning. CN spoke with client regarding starchy food quantities and sugary foods. Advised no added salt. Client appears to understand. CN will re-check BG and BP on Friday. CN will also speak with client's Alben SpittleWeaver case manager regarding client's cognitive issues.

## 2017-08-11 NOTE — Congregational Nurse Program (Signed)
Congregational Nurse Program Note  Date of Encounter: 07/18/2017  Past Medical History: Past Medical History:  Diagnosis Date  . Diabetes mellitus   . Homelessness   . Hyperlipidemia   . Hypertension   . TBI (traumatic brain injury) (HCC)   . Whiplash    04/2012    Encounter Details: CNP Questionnaire - 07/18/17 1000      Questionnaire   Patient Status  Not Applicable    Race  Black or African American    [facility]erved [facility]    Insurance  Not Applicable    Uninsured  Uninsured (Subsequent visits/quarter)    Food  Yes, have food insecurities;Within past 12 months, worried food would run out with no money to buy more    Housing/Utilities  No permanent housing    Transportation  Yes, need transportation assistance    Interpersonal Safety  No, do not feel physically and emotionally safe where you currently live    Medication  Yes, have medication insecurities    Medical Provider  Yes    Referrals  Primary Care Provider/Clinic    ED Visit Averted  Not Applicable    Life-Saving Intervention Made  Not Applicable      BP/BG check- client affect seems brighter today. He is smiling and more interactive. Noted some edema of lower extremities. Gave compression socks.

## 2017-08-13 NOTE — Congregational Nurse Program (Signed)
Congregational Nurse Program Note  Date of Encounter: 07/22/2017  Past Medical History: Past Medical History:  Diagnosis Date  . Diabetes mellitus   . Homelessness   . Hyperlipidemia   . Hypertension   . TBI (traumatic brain injury) (HCC)   . Whiplash    04/2012    Encounter Details: CNP Questionnaire - 07/22/17 0945      Questionnaire   Patient Status  Not Applicable    Race  Black or African American    Location Patient Served At  [facility]    Insurance  Not Applicable    Uninsured  Uninsured (Subsequent visits/quarter)    Food  Within past 12 months, worried food would run out with no money to buy more;Yes, have food insecurities    Housing/Utilities  No permanent housing    Transportation  No transportation needs    Interpersonal Safety  No, do not feel physically and emotionally safe where you currently live    Medication  Yes, have medication insecurities    Medical Provider  Yes    Referrals  Primary Care Provider/Clinic    ED Visit Averted  Not Applicable    Life-Saving Intervention Made  Not Applicable      BP/BG check. Client is loading his own pill box. Client took his medication this morning. CN will  Call [facility] to check on follow up appointment.
